# Patient Record
Sex: Male | Born: 1955 | Race: White | Hispanic: No | Marital: Married | Smoking: Never smoker
Health system: Southern US, Community
[De-identification: ages and names within clinical notes are randomized; demographics above are authoritative.]

## PROBLEM LIST (undated history)

## (undated) DIAGNOSIS — M109 Gout, unspecified: Secondary | ICD-10-CM

## (undated) DIAGNOSIS — L570 Actinic keratosis: Secondary | ICD-10-CM

## (undated) DIAGNOSIS — K219 Gastro-esophageal reflux disease without esophagitis: Secondary | ICD-10-CM

## (undated) DIAGNOSIS — J45909 Unspecified asthma, uncomplicated: Secondary | ICD-10-CM

## (undated) HISTORY — DX: Actinic keratosis: L57.0

## (undated) HISTORY — PX: COLONOSCOPY: SHX174

## (undated) HISTORY — PX: NASAL SINUS SURGERY: SHX719

---

## 1985-09-01 HISTORY — PX: APPENDECTOMY: SHX54

## 2007-05-21 ENCOUNTER — Ambulatory Visit: Payer: Self-pay | Admitting: Internal Medicine

## 2007-05-21 IMAGING — CT CT ABD-PELV W/ CM
1 of 2 series · 15 of 32 positions shown, 19 images · non-contrast
Comparison: none

REASON FOR EXAM: Abd and Pelvic Pain
COMMENTS:

PROCEDURE:     STROMAN - STROMAN ABDOMEN / PELVIS W  - [DATE]  [DATE]
RESULT:     Comparison: Report of abdomen CT on [DATE] is reviewed.
Images are not currently available for direct comparison.
TECHNIQUE: CT examination of the abdomen and pelvis was performed after
intravenous administration of 100 cc of [C7] nonionic contrast in
addition to oral contrast. Collimation is 8 mm.

[Series 2: soft tissue · axial · 0.67mm/px · z∈[-985,-561]mm · 15 of 59 slices shown, 19 images]
[im 3/59  soft-tissue]
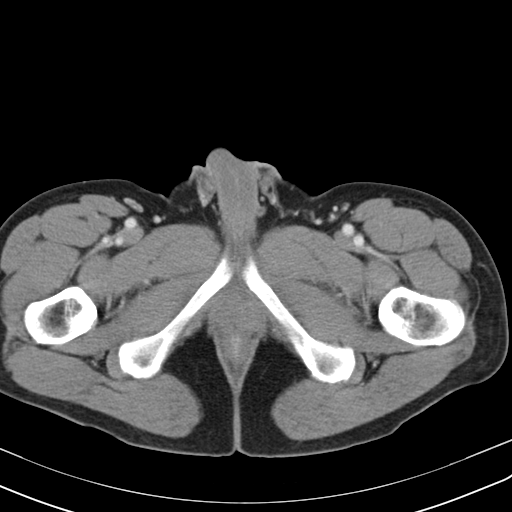
[im 3/59  bone]
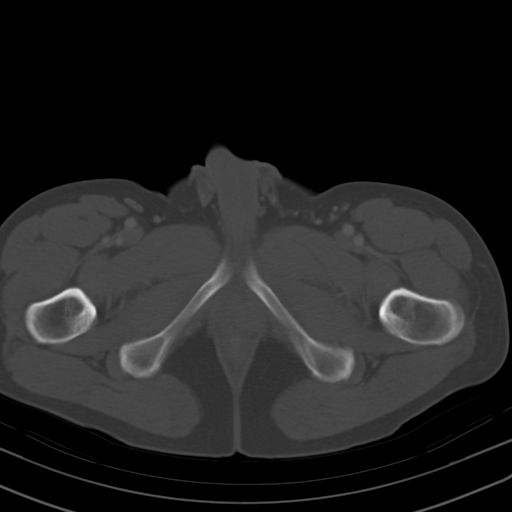
[im 8/59  soft-tissue]
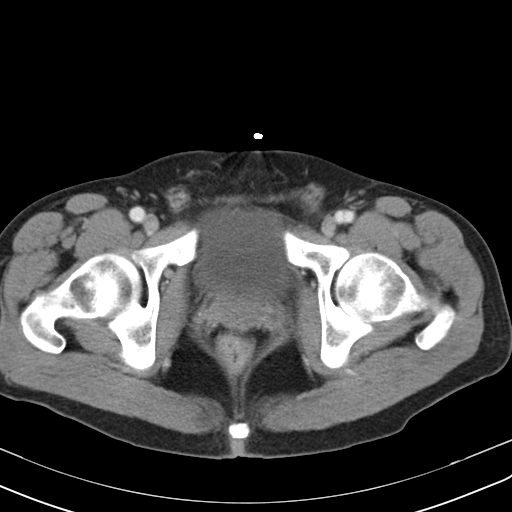
[im 13/59  soft-tissue]
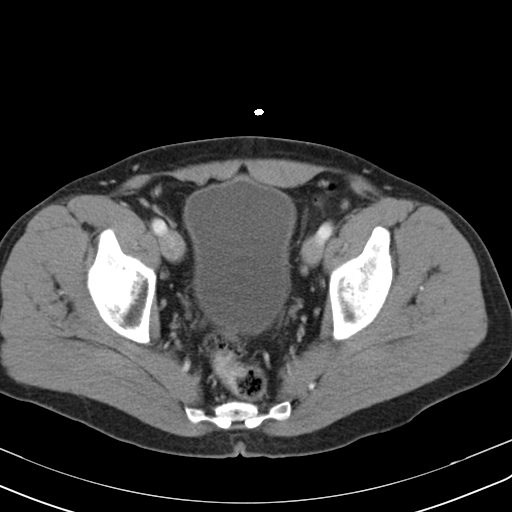
[im 16/59  soft-tissue]
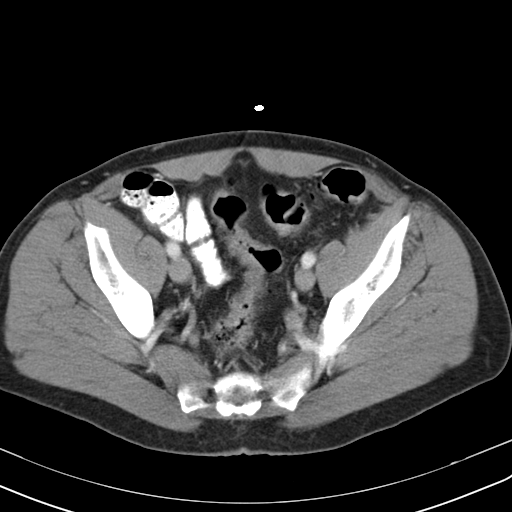
[im 21/59  soft-tissue]
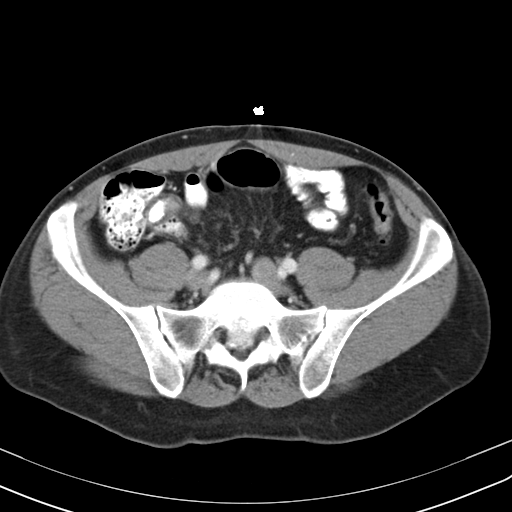
[im 26/59  soft-tissue]
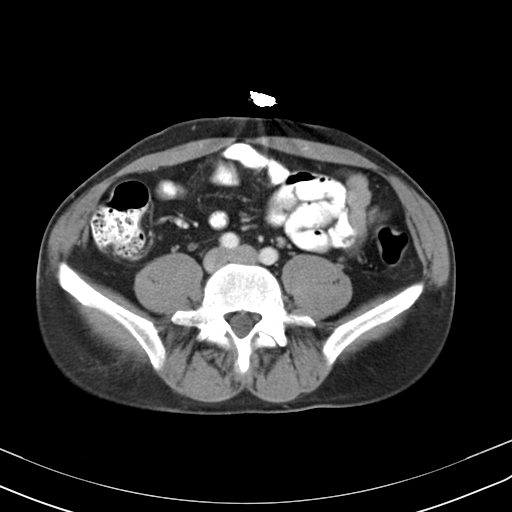
[im 31/59  soft-tissue]
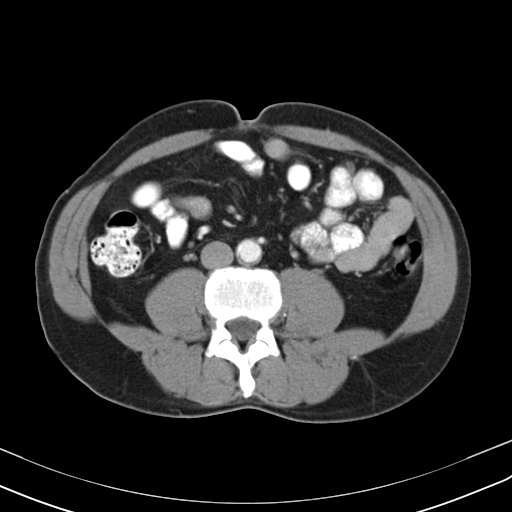
[im 33/59  soft-tissue]
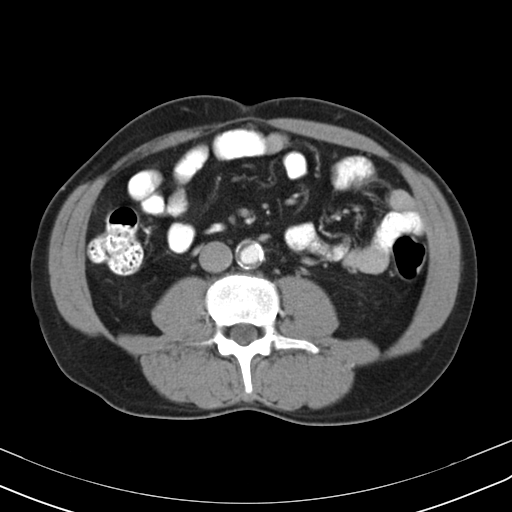
[im 38/59  soft-tissue]
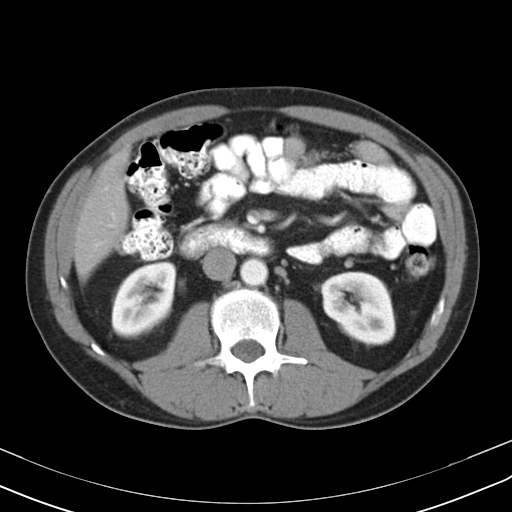
[im 38/59  bone]
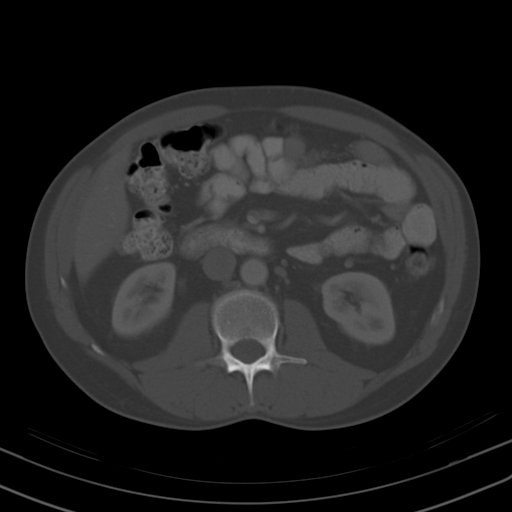
[im 43/59  soft-tissue]
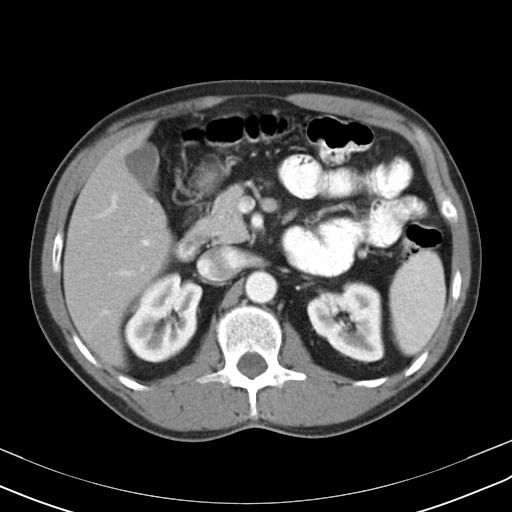
[im 46/59  soft-tissue]
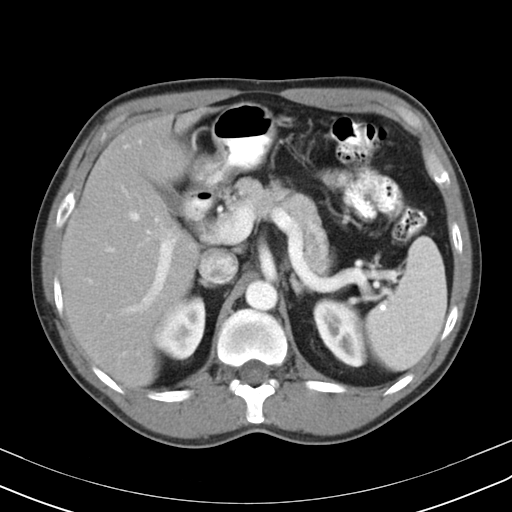
[im 48/59  lung]
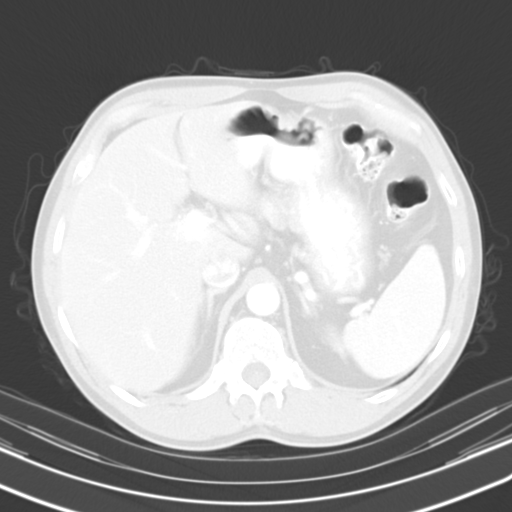
[im 51/59  soft-tissue]
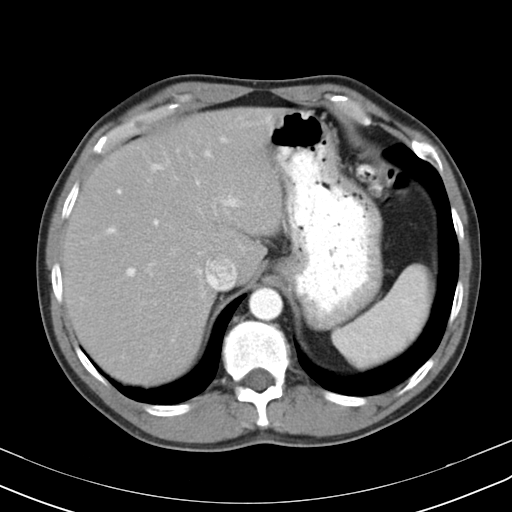
[im 51/59  lung]
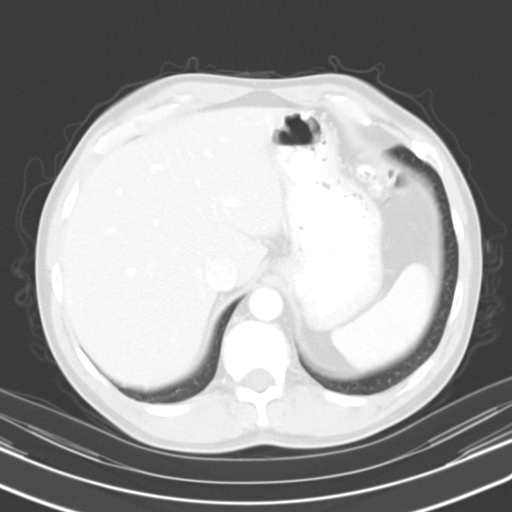
[im 53/59  lung]
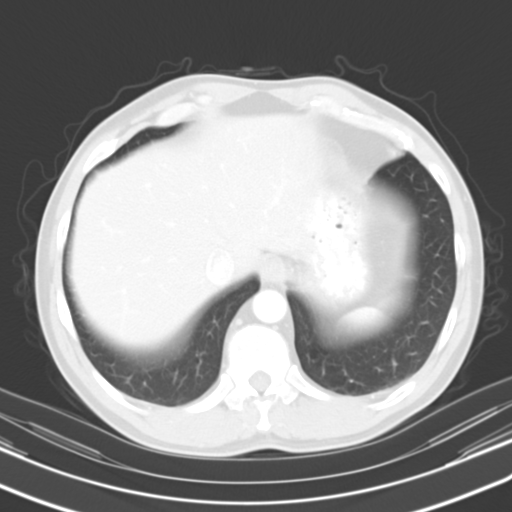
[im 56/59  soft-tissue]
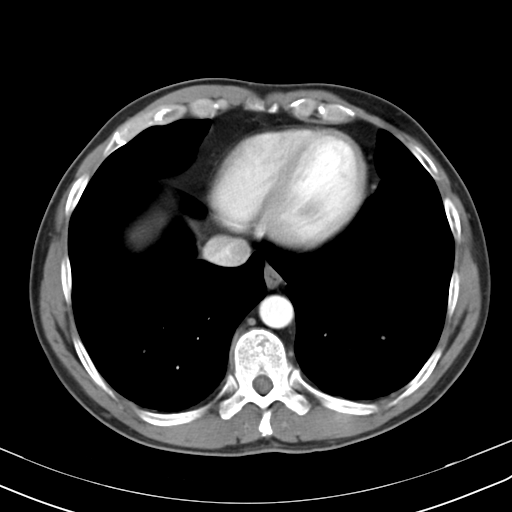
[im 56/59  lung]
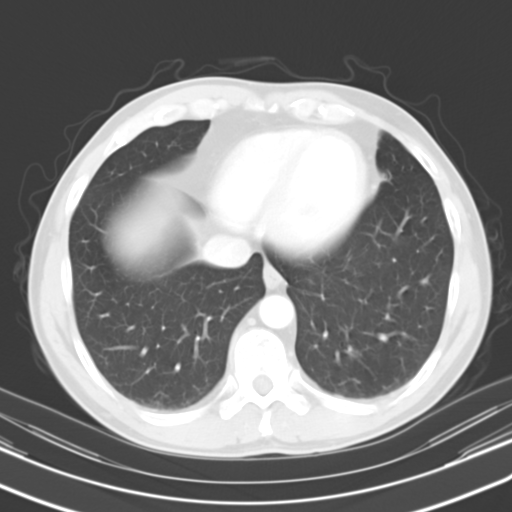

[15 of 32 positions shown; findings below may reference images not displayed]

FINDINGS: Limited evaluation the lung bases shows a calcified 1.1 cm left basilar
pulmonary nodule, likely due to old granulomatous disease.

The liver, gallbladder, pain please come adrenal glands, and kidneys are
unremarkable. A few calcified nodules are seen involving the spleen,
consistent with old granulomatous disease.

There is no dilatation or definite wall thickening of the  bowels. The
appendix is not definitely seen. Mild distal colonic diverticulosis is noted
without evidence of diverticulitis. There is no significant intra
abdominal/pelvic fat stranding. There is no intraperitoneal free air. There
is no significant free fluid. There are no enlarged abdominal pelvic lymph
nodes.
IMPRESSION: 1. Mild distal colonic diverticulosis.

## 2008-04-27 ENCOUNTER — Ambulatory Visit: Payer: Self-pay | Admitting: Unknown Physician Specialty

## 2011-05-02 ENCOUNTER — Ambulatory Visit: Payer: Self-pay | Admitting: Internal Medicine

## 2011-05-02 IMAGING — US ABDOMEN ULTRASOUND LIMITED
1 series · 17 of 25 positions shown · non-contrast
Comparison: none

REASON FOR EXAM: abdominal pain and nausea eval gallbladder
COMMENTS:

[Series 1: abdomen ultrasound limited · 17 of 65 slices shown]
[im 1/65]
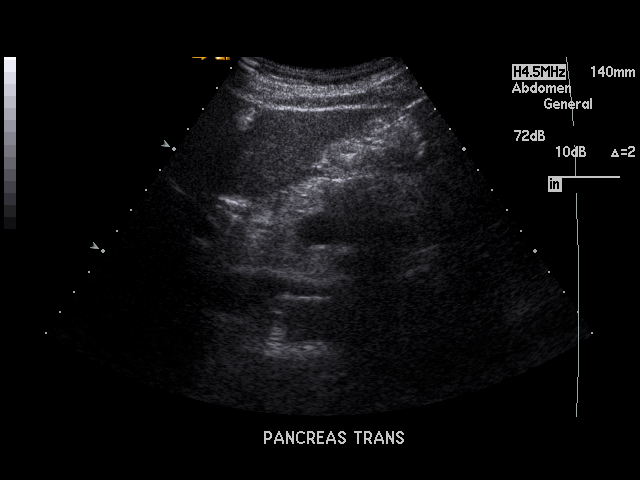
[im 6/65]
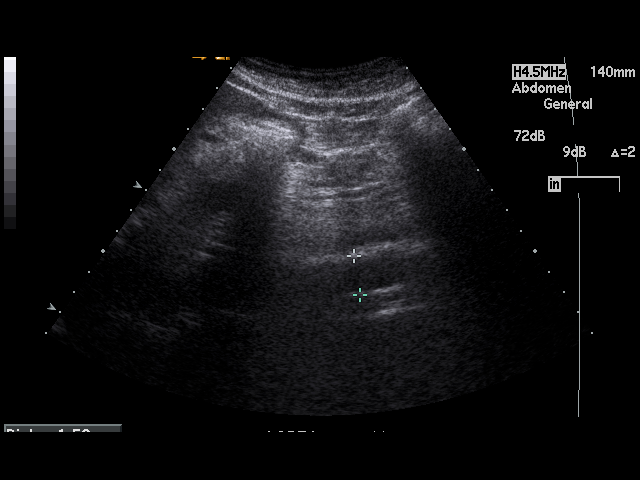
[im 9/65]
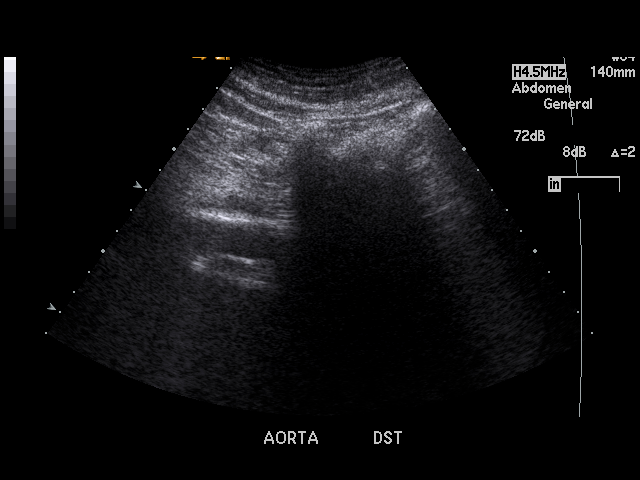
[im 14/65]
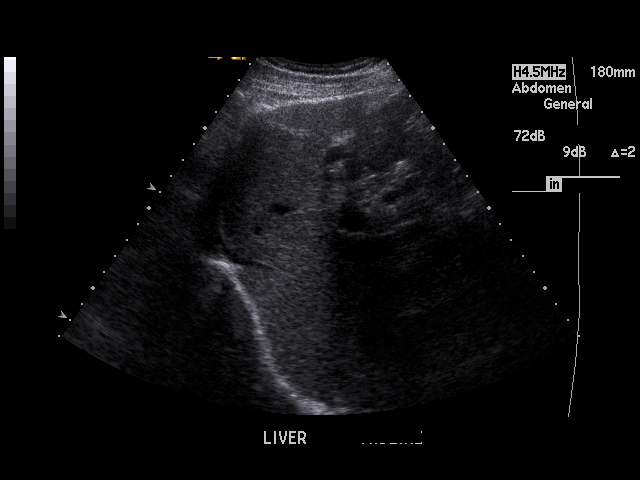
[im 17/65]
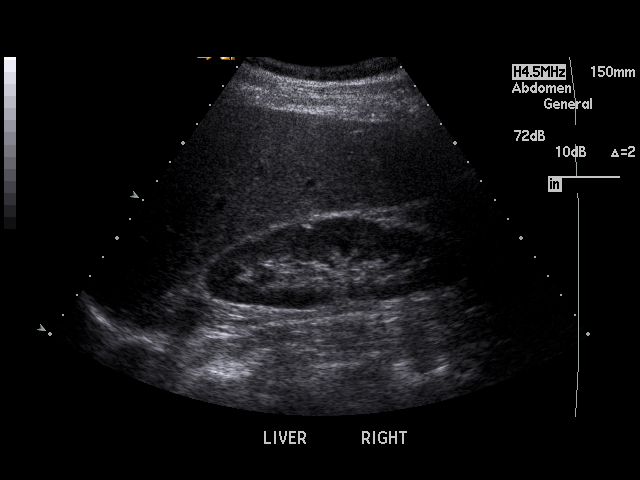
[im 22/65]
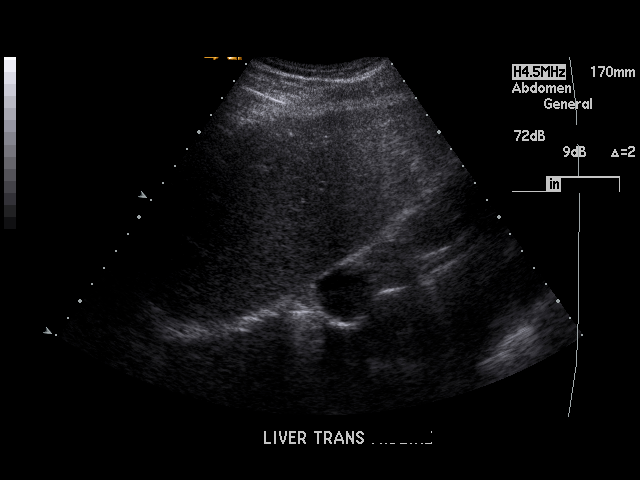
[im 25/65]
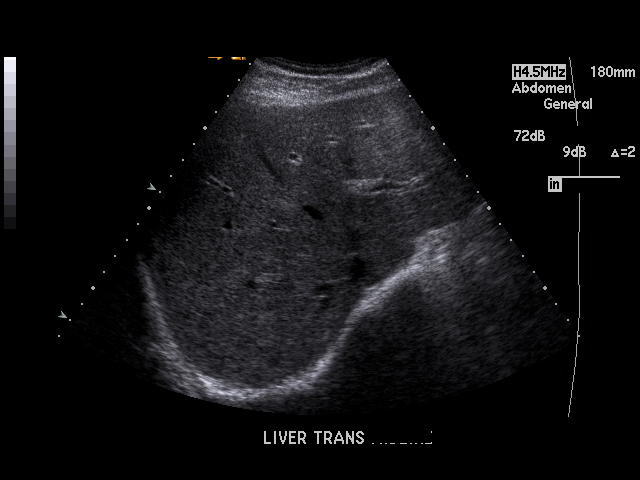
[im 30/65]
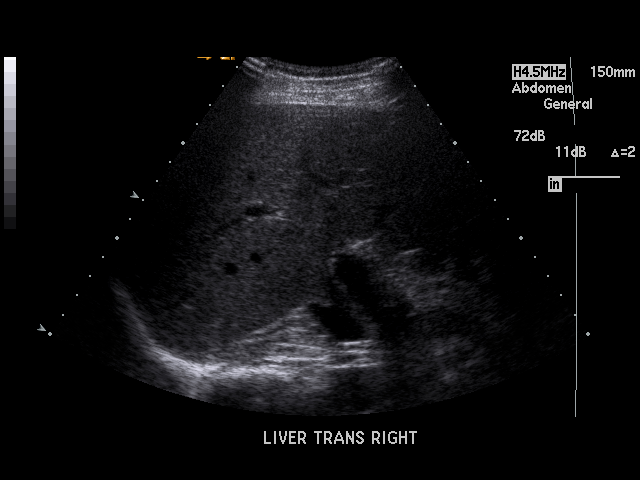
[im 33/65]
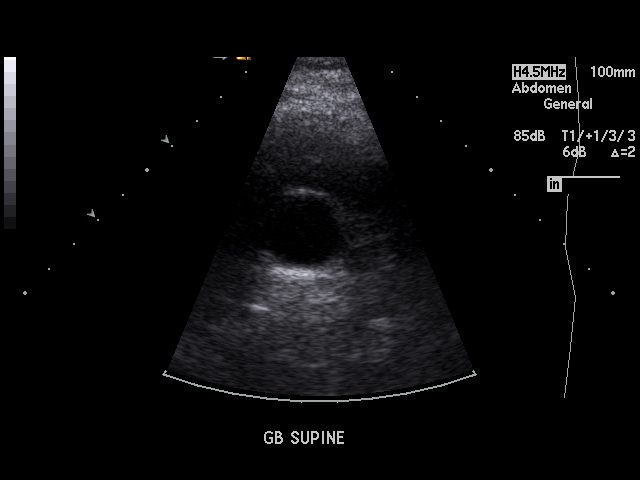
[im 35/65]
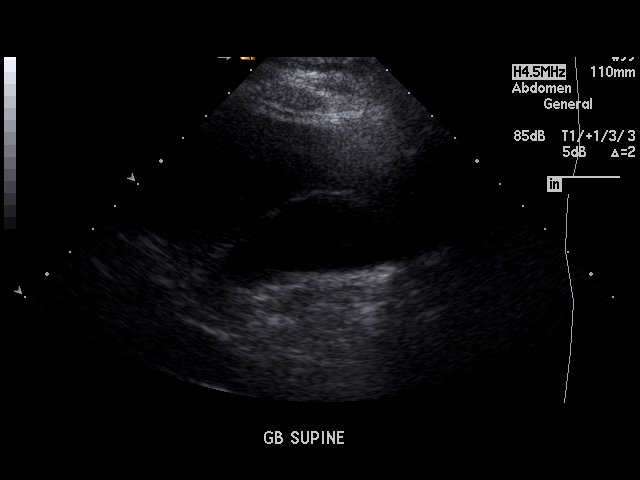
[im 41/65]
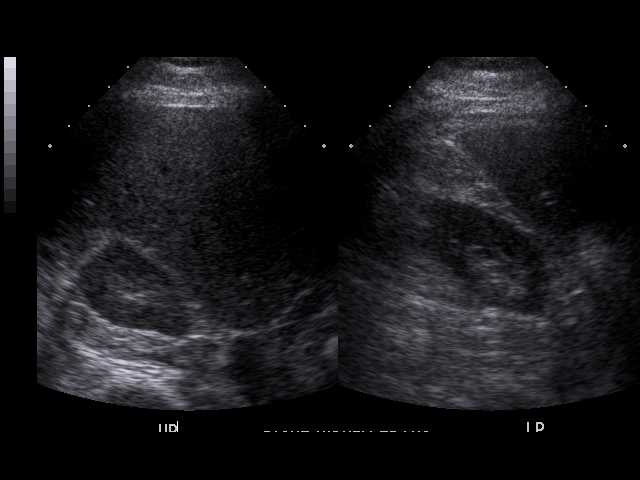
[im 43/65]
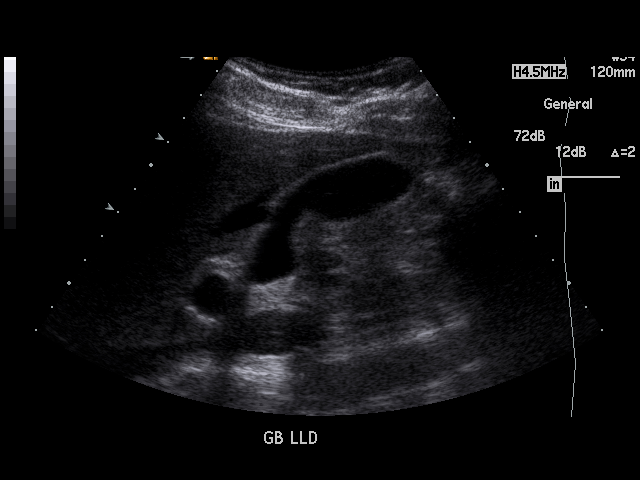
[im 49/65]
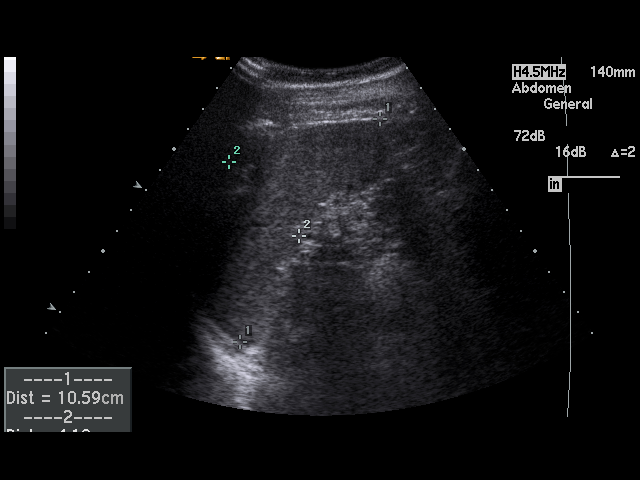
[im 51/65]
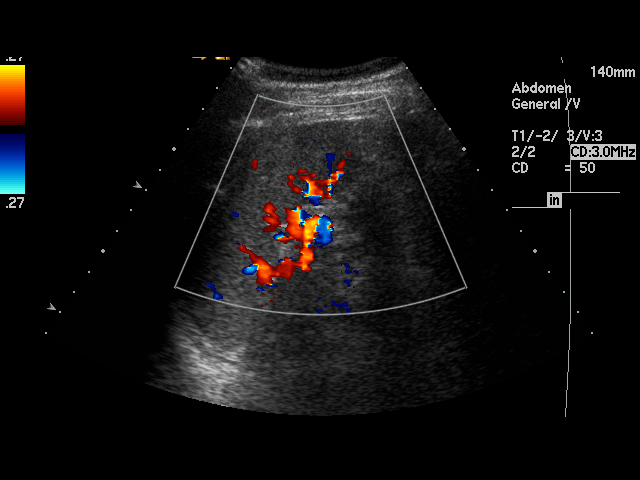
[im 57/65]
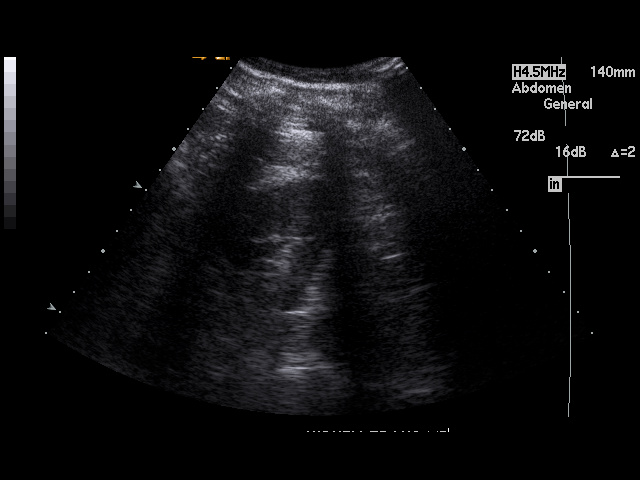
[im 59/65]
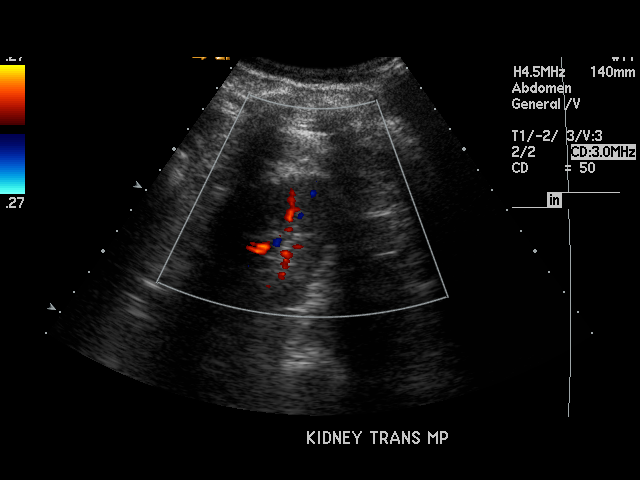
[im 65/65]
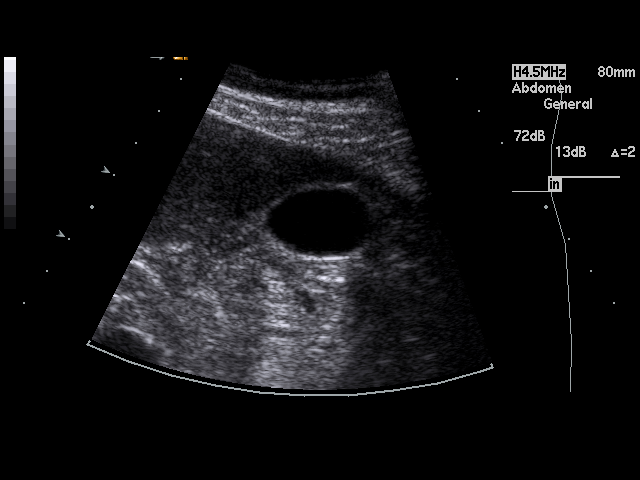

[17 of 25 positions shown; findings below may reference images not displayed]

PROCEDURE:     HARTMANN - HARTMANN ABDOMEN UPPER GENERAL  - [DATE]  [DATE]

RESULT:     The tail of the pancreas is obscured by bowel gas but the
remainder of the pancreas is normal in appearance. The hepatic echo pattern
is normal. The spleen, abdominal aorta and inferior vena cava show no
significant abnormalities. No gallstones are seen. There is no thickening of
the gallbladder wall. The common bile duct measures 2.4 mm in diameter which
is within normal limits. The kidneys reveal no hydronephrosis. There is no
ascites. Sagittally, the right kidney measures 11.1 cm and the left measures
11.2 cm.
IMPRESSION: No significant abnormalities are noted.

## 2011-10-14 ENCOUNTER — Ambulatory Visit: Payer: Self-pay | Admitting: Unknown Physician Specialty

## 2011-10-15 LAB — PATHOLOGY REPORT

## 2016-07-31 ENCOUNTER — Other Ambulatory Visit: Payer: Self-pay | Admitting: Internal Medicine

## 2016-07-31 DIAGNOSIS — R1084 Generalized abdominal pain: Secondary | ICD-10-CM

## 2016-08-01 ENCOUNTER — Ambulatory Visit
Admission: RE | Admit: 2016-08-01 | Discharge: 2016-08-01 | Disposition: A | Discharge status: Home / Self Care | Payer: 59 | Source: Ambulatory Visit | Attending: Internal Medicine | Admitting: Internal Medicine

## 2016-08-01 DIAGNOSIS — I708 Atherosclerosis of other arteries: Secondary | ICD-10-CM | POA: Insufficient documentation

## 2016-08-01 DIAGNOSIS — I700 Atherosclerosis of aorta: Secondary | ICD-10-CM | POA: Insufficient documentation

## 2016-08-01 DIAGNOSIS — D71 Functional disorders of polymorphonuclear neutrophils: Secondary | ICD-10-CM | POA: Diagnosis not present

## 2016-08-01 DIAGNOSIS — R1084 Generalized abdominal pain: Secondary | ICD-10-CM | POA: Insufficient documentation

## 2016-08-01 HISTORY — DX: Unspecified asthma, uncomplicated: J45.909

## 2016-08-01 IMAGING — CT CT ABD-PELV W/ CM
2 of 5 series · 14 of 46 positions shown, 16 images · IV contrast (APPLIED)
Comparison: [DATE]

CLINICAL DATA: Abdominal cramping and pain

EXAM:
CT ABDOMEN AND PELVIS WITH CONTRAST
TECHNIQUE: Multidetector CT imaging of the abdomen and pelvis was performed
using the standard protocol following bolus administration of
intravenous contrast. Oral contrast was also administered.
CONTRAST:  100mL [5T] IOPAMIDOL ([5T]) INJECTION 61%

[Series 2: axial st · axial · 0.67mm/px · z∈[-936,-481]mm · 11 of 101 slices shown, 13 images]
[im 5/101  soft-tissue]
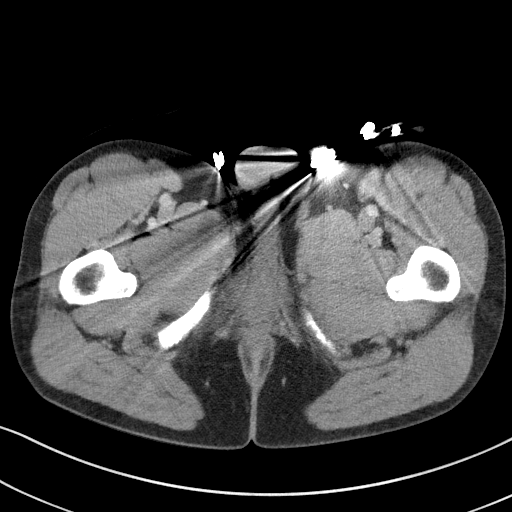
[im 5/101  bone]
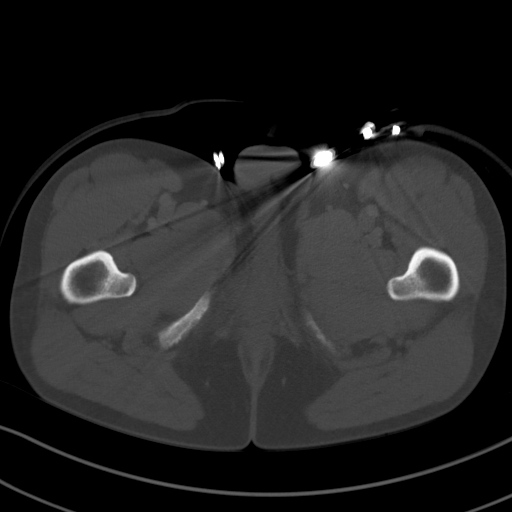
[im 15/101  soft-tissue]
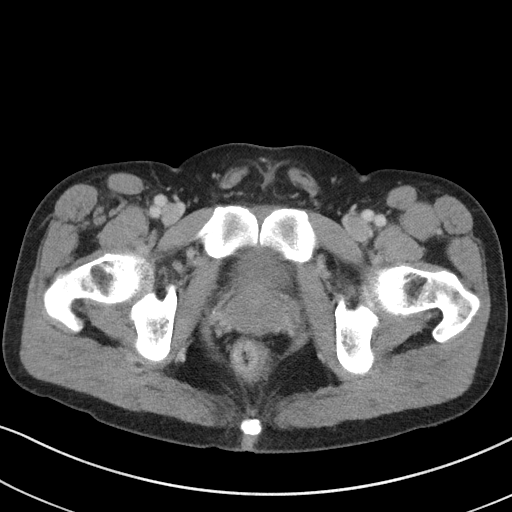
[im 24/101  soft-tissue]
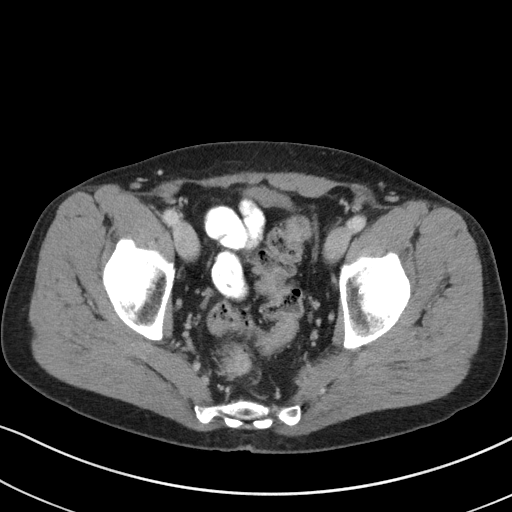
[im 34/101  soft-tissue]
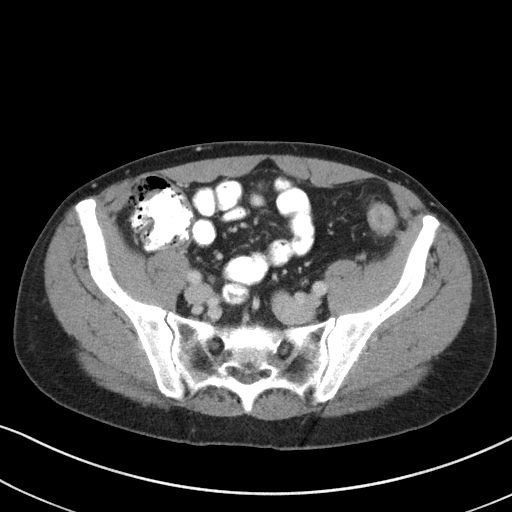
[im 43/101  soft-tissue]
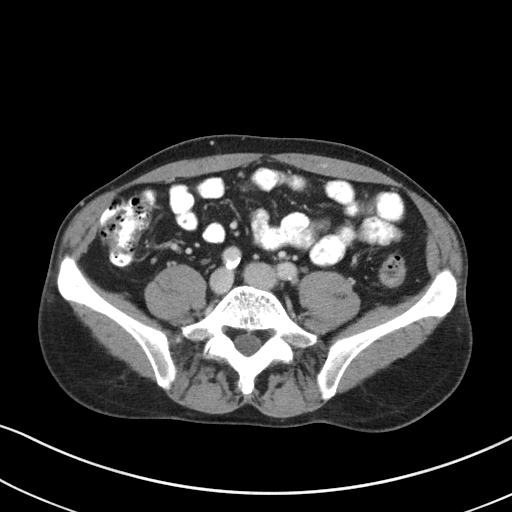
[im 53/101  soft-tissue]
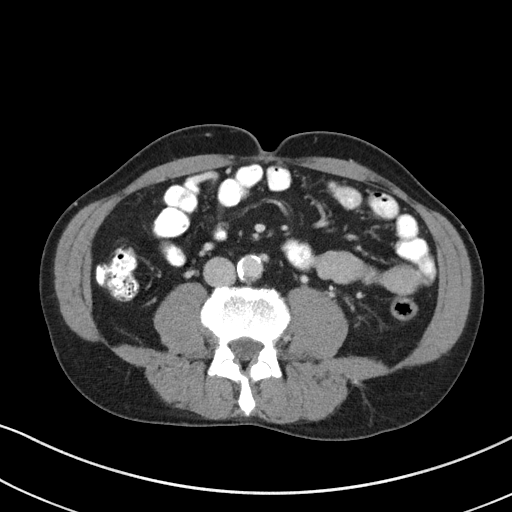
[im 58/101  soft-tissue]
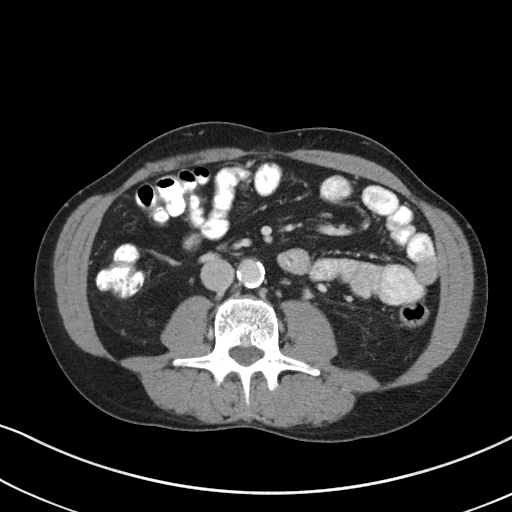
[im 67/101  soft-tissue]
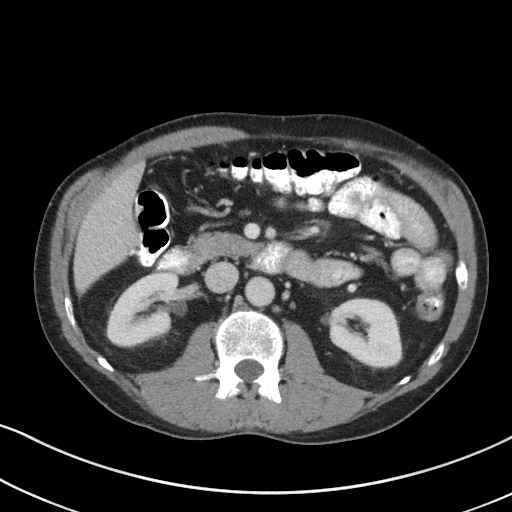
[im 77/101  soft-tissue]
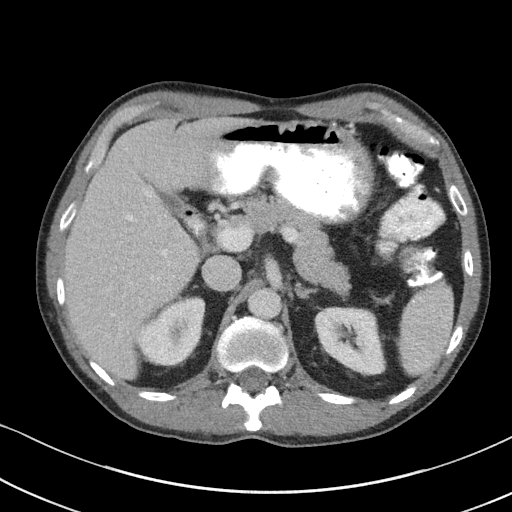
[im 77/101  bone]
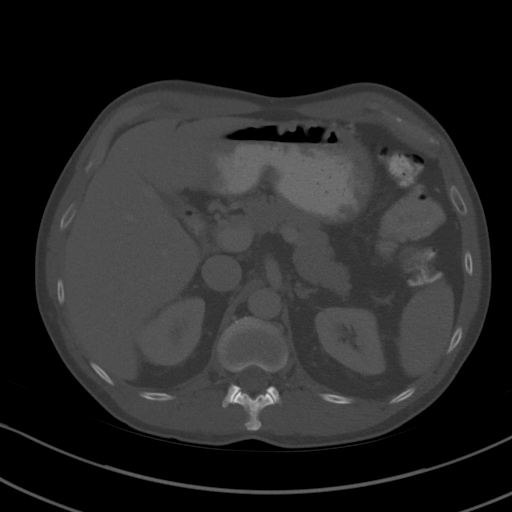
[im 86/101  soft-tissue]
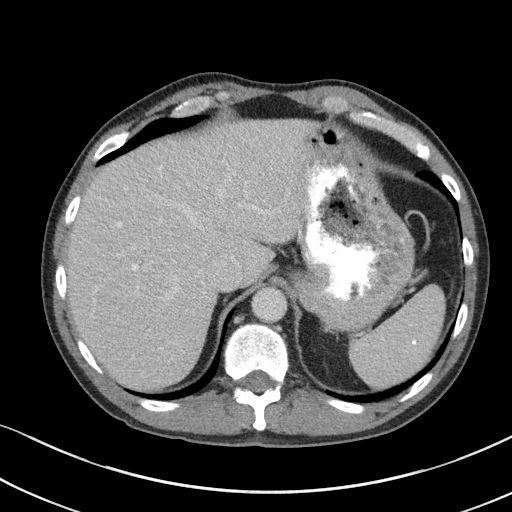
[im 96/101  soft-tissue]
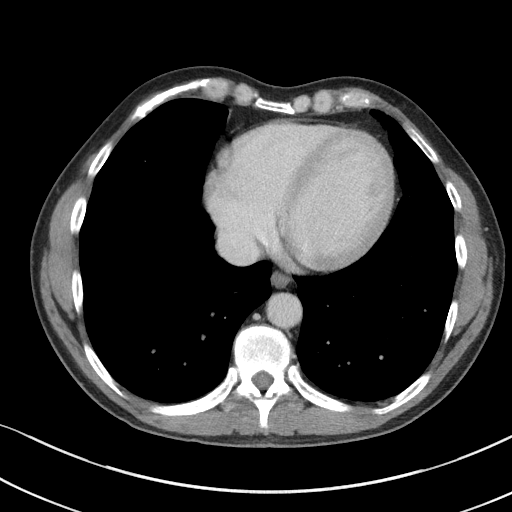

[Series 7: coronal st · coronal · 0.62mm/px · 3 of 78 slices shown]
[im 26/78  soft-tissue]
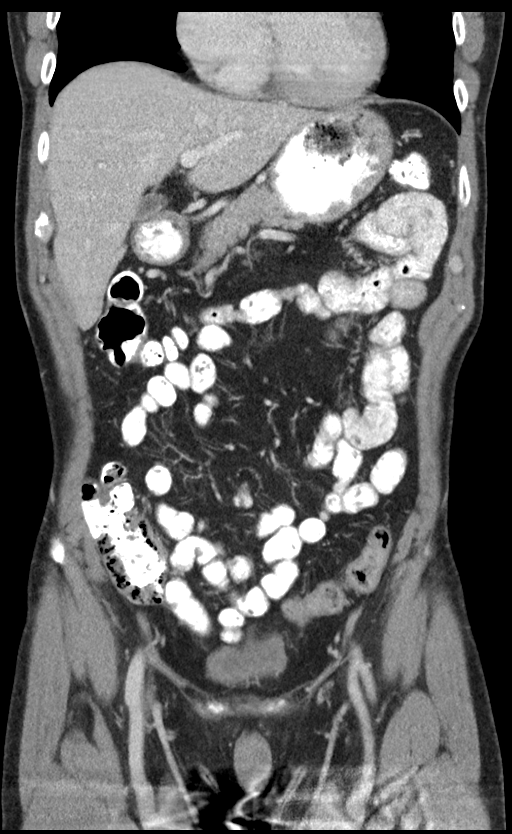
[im 35/78  soft-tissue]
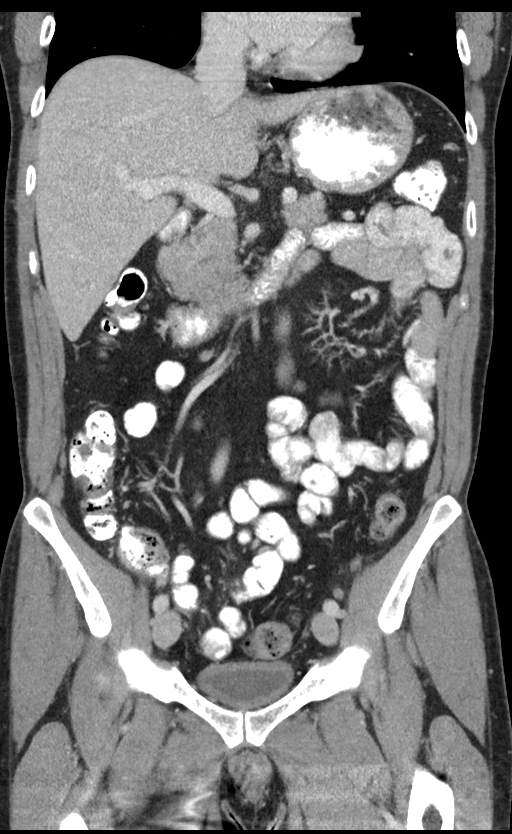
[im 43/78  soft-tissue]
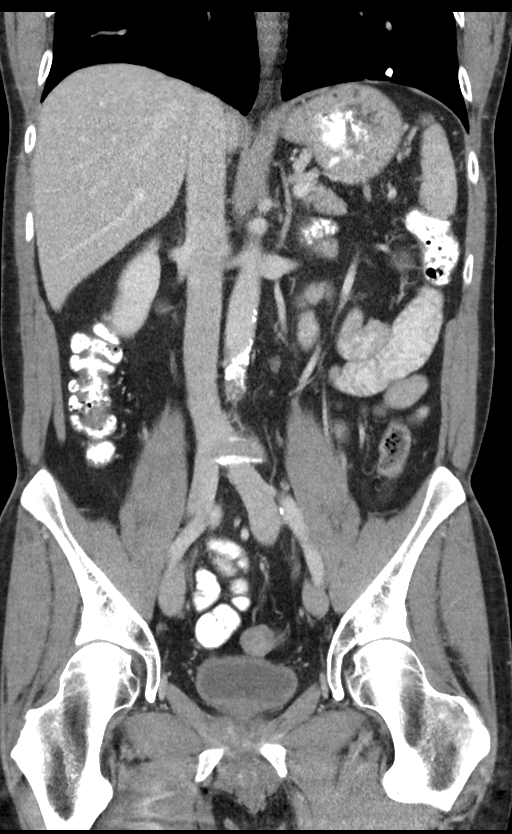

[14 of 46 positions shown; findings below may reference images not displayed]

FINDINGS: Lower chest: There is a calcified granuloma in the left lower lobe
slightly superior to the left hemidiaphragm. Lung bases otherwise
are clear. A small amount of oral contrast is noted in the distal
esophagus.

Hepatobiliary: No focal liver lesions are evident. There is no
appreciable gallbladder wall thickening. There is no biliary duct
dilatation.

Pancreas: There is no evident pancreatic mass or inflammatory focus.

Spleen: No splenic lesions are evident beyond occasional small
calcified splenic granulomas.

Adrenals/Urinary Tract: Adrenals bilaterally appear normal. Kidneys
bilaterally show no evidence of mass or hydronephrosis on either
side. There is no renal or ureteral calculus on either side. Urinary
bladder is midline. Urinary bladder wall is mildly thickened.

Stomach/Bowel: There is no appreciable bowel wall or mesenteric
thickening. There is no evident bowel obstruction. There is no free
air or portal venous air.

Vascular/Lymphatic: There is atherosclerotic calcification in the
aorta and common iliac arteries. There is slight thrombus in the
periphery of the distal aorta. There is no abdominal aortic
aneurysm. The major mesenteric vessels appear intact. There is no
adenopathy in the abdomen or pelvis.

Reproductive: Prostate and seminal vesicles appear normal in size
and contour. There is no pelvic mass or pelvic fluid collection.

Other: Appendix not appreciated. There is no periappendiceal region
inflammation. Terminal ileum appears unremarkable. There is no
ascites or abscess in the abdomen or pelvis.

Musculoskeletal: There are no blastic or lytic bone lesions. There
is no abdominal wall or intramuscular lesion.
IMPRESSION: No bowel wall thickening or bowel obstruction. No abscess. No
periappendiceal region inflammation.

No renal or ureteral calculus.  No hydronephrosis.

Evidence of prior granulomatous disease.

Aortoiliac atherosclerosis.  No abdominal aortic aneurysm.

Small amount of oral contrast in the esophagus distally raises
question of a degree of gastroesophageal reflux.

## 2016-08-01 MED ORDER — IOPAMIDOL (ISOVUE-300) INJECTION 61%
100.0000 mL | Freq: Once | INTRAVENOUS | Status: AC | PRN
  Administered 2016-08-01: 100 mL via INTRAVENOUS

## 2016-11-25 DIAGNOSIS — R0781 Pleurodynia: Secondary | ICD-10-CM | POA: Diagnosis not present

## 2016-11-25 DIAGNOSIS — R972 Elevated prostate specific antigen [PSA]: Secondary | ICD-10-CM | POA: Diagnosis not present

## 2016-11-25 DIAGNOSIS — M1A00X0 Idiopathic chronic gout, unspecified site, without tophus (tophi): Secondary | ICD-10-CM | POA: Diagnosis not present

## 2016-12-02 DIAGNOSIS — Z0000 Encounter for general adult medical examination without abnormal findings: Secondary | ICD-10-CM | POA: Diagnosis not present

## 2016-12-02 DIAGNOSIS — R972 Elevated prostate specific antigen [PSA]: Secondary | ICD-10-CM | POA: Diagnosis not present

## 2016-12-03 DIAGNOSIS — L821 Other seborrheic keratosis: Secondary | ICD-10-CM | POA: Diagnosis not present

## 2016-12-03 DIAGNOSIS — Z1283 Encounter for screening for malignant neoplasm of skin: Secondary | ICD-10-CM | POA: Diagnosis not present

## 2016-12-03 DIAGNOSIS — L570 Actinic keratosis: Secondary | ICD-10-CM | POA: Diagnosis not present

## 2016-12-03 DIAGNOSIS — L578 Other skin changes due to chronic exposure to nonionizing radiation: Secondary | ICD-10-CM | POA: Diagnosis not present

## 2017-03-31 DIAGNOSIS — Z0000 Encounter for general adult medical examination without abnormal findings: Secondary | ICD-10-CM | POA: Diagnosis not present

## 2017-04-27 DIAGNOSIS — N401 Enlarged prostate with lower urinary tract symptoms: Secondary | ICD-10-CM | POA: Diagnosis not present

## 2017-04-27 DIAGNOSIS — R972 Elevated prostate specific antigen [PSA]: Secondary | ICD-10-CM | POA: Diagnosis not present

## 2017-06-07 DIAGNOSIS — Z23 Encounter for immunization: Secondary | ICD-10-CM | POA: Diagnosis not present

## 2017-06-17 DIAGNOSIS — R972 Elevated prostate specific antigen [PSA]: Secondary | ICD-10-CM | POA: Diagnosis not present

## 2017-06-29 DIAGNOSIS — N401 Enlarged prostate with lower urinary tract symptoms: Secondary | ICD-10-CM | POA: Diagnosis not present

## 2017-06-29 DIAGNOSIS — R972 Elevated prostate specific antigen [PSA]: Secondary | ICD-10-CM | POA: Diagnosis not present

## 2017-10-13 DIAGNOSIS — S46011A Strain of muscle(s) and tendon(s) of the rotator cuff of right shoulder, initial encounter: Secondary | ICD-10-CM | POA: Diagnosis not present

## 2017-12-08 DIAGNOSIS — Z0000 Encounter for general adult medical examination without abnormal findings: Secondary | ICD-10-CM | POA: Diagnosis not present

## 2017-12-08 DIAGNOSIS — Z125 Encounter for screening for malignant neoplasm of prostate: Secondary | ICD-10-CM | POA: Diagnosis not present

## 2017-12-15 DIAGNOSIS — Z23 Encounter for immunization: Secondary | ICD-10-CM | POA: Diagnosis not present

## 2017-12-15 DIAGNOSIS — Z0000 Encounter for general adult medical examination without abnormal findings: Secondary | ICD-10-CM | POA: Diagnosis not present

## 2017-12-15 DIAGNOSIS — R972 Elevated prostate specific antigen [PSA]: Secondary | ICD-10-CM | POA: Diagnosis not present

## 2017-12-16 DIAGNOSIS — Z1283 Encounter for screening for malignant neoplasm of skin: Secondary | ICD-10-CM | POA: Diagnosis not present

## 2017-12-16 DIAGNOSIS — D225 Melanocytic nevi of trunk: Secondary | ICD-10-CM | POA: Diagnosis not present

## 2017-12-16 DIAGNOSIS — L570 Actinic keratosis: Secondary | ICD-10-CM | POA: Diagnosis not present

## 2017-12-16 DIAGNOSIS — D1800 Hemangioma unspecified site: Secondary | ICD-10-CM | POA: Diagnosis not present

## 2018-06-18 DIAGNOSIS — R972 Elevated prostate specific antigen [PSA]: Secondary | ICD-10-CM | POA: Diagnosis not present

## 2018-06-28 DIAGNOSIS — N401 Enlarged prostate with lower urinary tract symptoms: Secondary | ICD-10-CM | POA: Diagnosis not present

## 2018-06-28 DIAGNOSIS — R972 Elevated prostate specific antigen [PSA]: Secondary | ICD-10-CM | POA: Diagnosis not present

## 2018-06-28 DIAGNOSIS — Z125 Encounter for screening for malignant neoplasm of prostate: Secondary | ICD-10-CM | POA: Diagnosis not present

## 2018-06-30 ENCOUNTER — Other Ambulatory Visit: Payer: Self-pay | Admitting: Internal Medicine

## 2018-06-30 DIAGNOSIS — R51 Headache: Secondary | ICD-10-CM | POA: Diagnosis not present

## 2018-06-30 DIAGNOSIS — R1084 Generalized abdominal pain: Secondary | ICD-10-CM

## 2018-07-02 ENCOUNTER — Other Ambulatory Visit (HOSPITAL_COMMUNITY): Payer: Self-pay | Admitting: Urology

## 2018-07-02 DIAGNOSIS — R972 Elevated prostate specific antigen [PSA]: Secondary | ICD-10-CM

## 2018-07-05 ENCOUNTER — Ambulatory Visit: Admission: RE | Admit: 2018-07-05 | Payer: 59 | Source: Ambulatory Visit

## 2018-07-08 ENCOUNTER — Ambulatory Visit
Admission: RE | Admit: 2018-07-08 | Discharge: 2018-07-08 | Disposition: A | Discharge status: Home / Self Care | Payer: Commercial Managed Care - HMO | Source: Ambulatory Visit | Attending: Internal Medicine | Admitting: Internal Medicine

## 2018-07-08 DIAGNOSIS — K5730 Diverticulosis of large intestine without perforation or abscess without bleeding: Secondary | ICD-10-CM | POA: Insufficient documentation

## 2018-07-08 DIAGNOSIS — R1084 Generalized abdominal pain: Secondary | ICD-10-CM | POA: Insufficient documentation

## 2018-07-08 DIAGNOSIS — I700 Atherosclerosis of aorta: Secondary | ICD-10-CM | POA: Insufficient documentation

## 2018-07-08 IMAGING — CT CT ABD-PELV W/ CM
2 of 5 series · 15 of 46 positions shown, 17 images · IV contrast (iopamidol)
Comparison: CT Abdomen and Pelvis [DATE], [DATE].

CLINICAL DATA: 62-year-old male with right upper quadrant pain for
6 weeks. Prior appendectomy.

EXAM:
CT ABDOMEN AND PELVIS WITH CONTRAST
TECHNIQUE: Multidetector CT imaging of the abdomen and pelvis was performed
using the standard protocol following bolus administration of
intravenous contrast.
CONTRAST:  100mL [ZN] IOPAMIDOL ([ZN]) INJECTION 61%

[Series 2: abd pelvis · axial · 0.64mm/px · z∈[-1599,-1174]mm · 12 of 95 slices shown, 14 images (1 of 2)]
[im 5/95  soft-tissue]
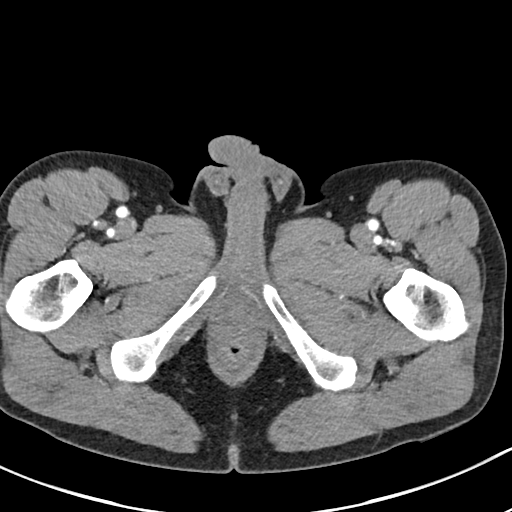
[im 5/95  bone]
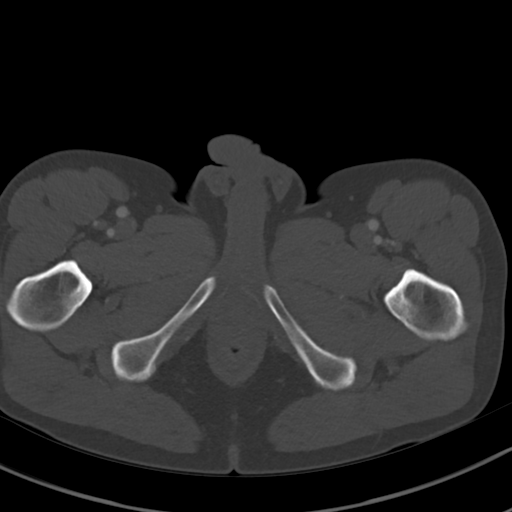
[im 15/95  soft-tissue]
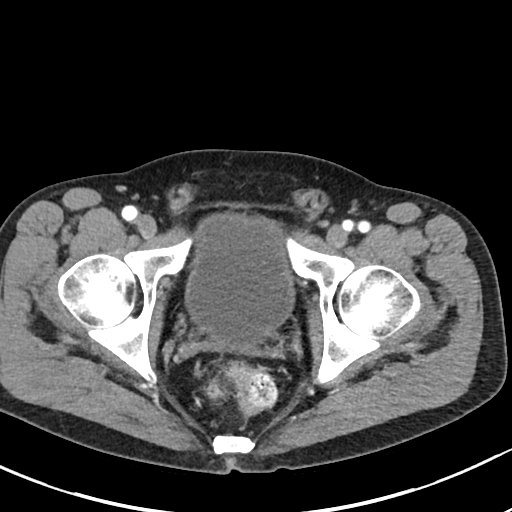
[im 20/95  soft-tissue]
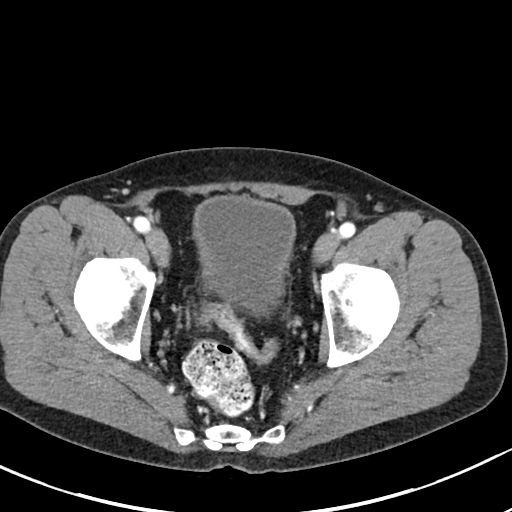
[im 30/95  soft-tissue]
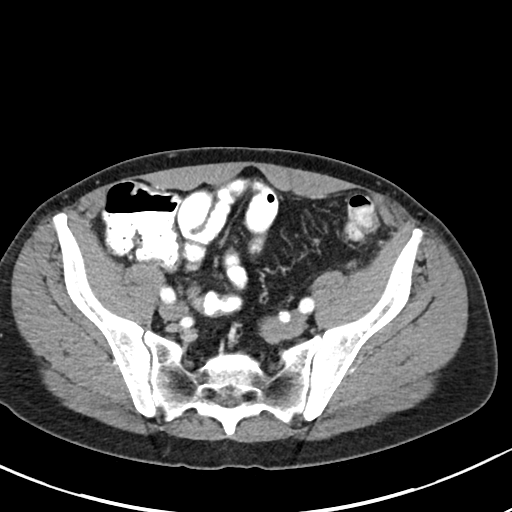
[im 35/95  soft-tissue]
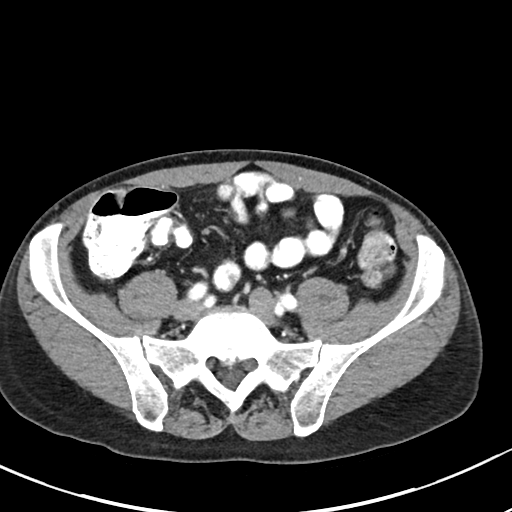
[im 45/95  soft-tissue]
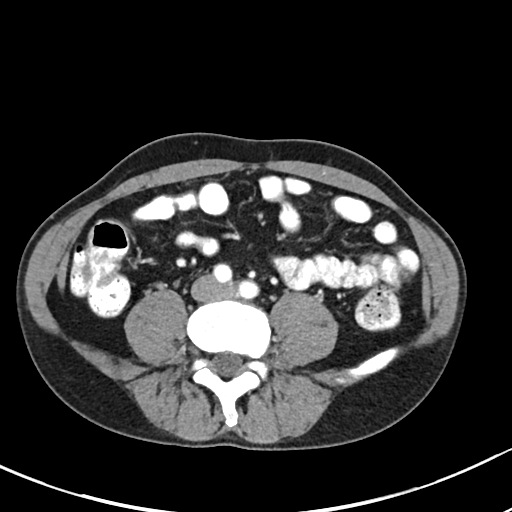
[im 50/95  soft-tissue]
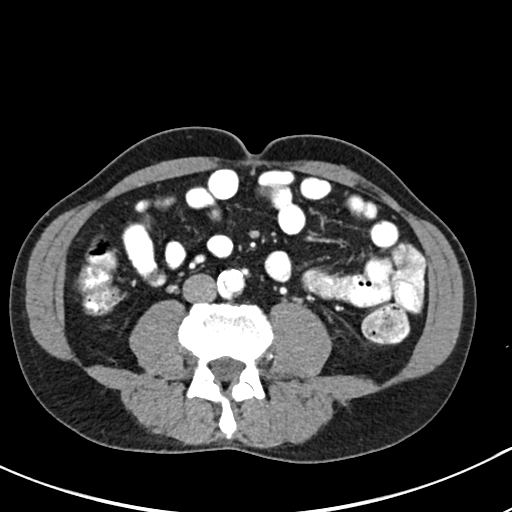
[im 60/95  soft-tissue]
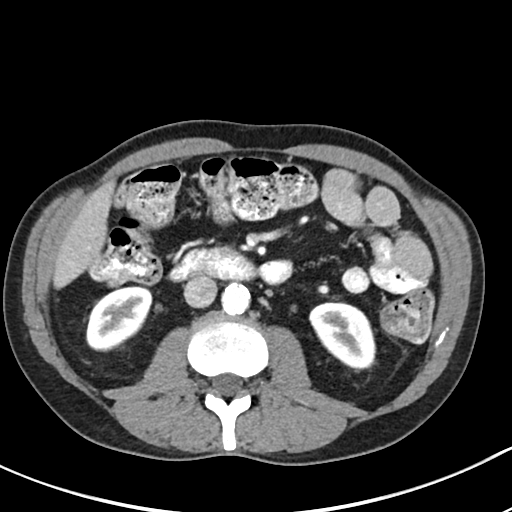
[im 65/95  soft-tissue]
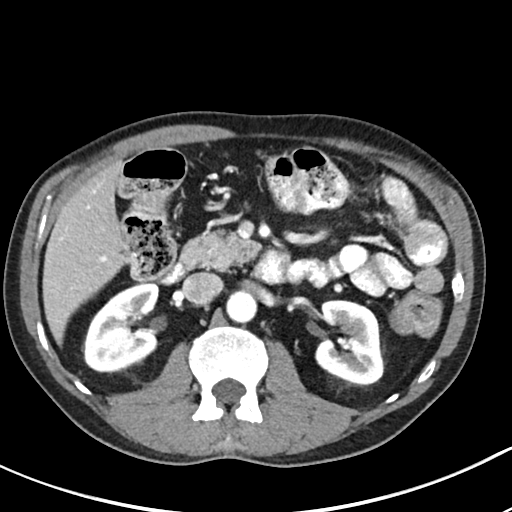
[im 65/95  bone]
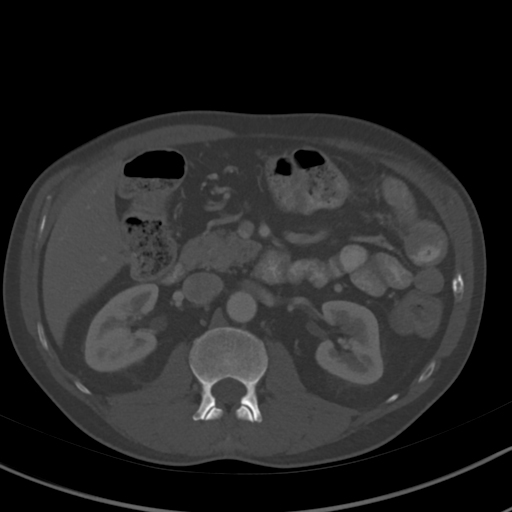
[im 75/95  soft-tissue]
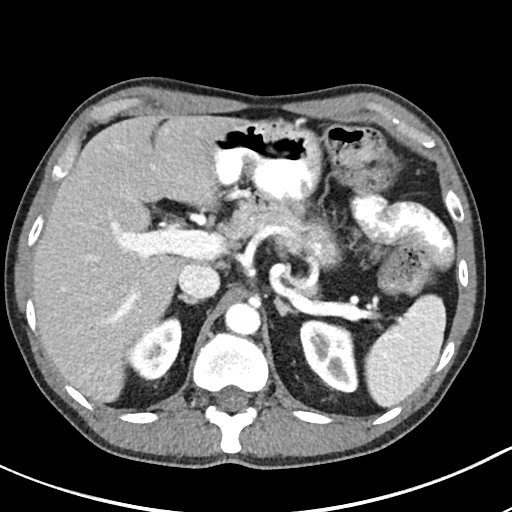
[im 80/95  soft-tissue]
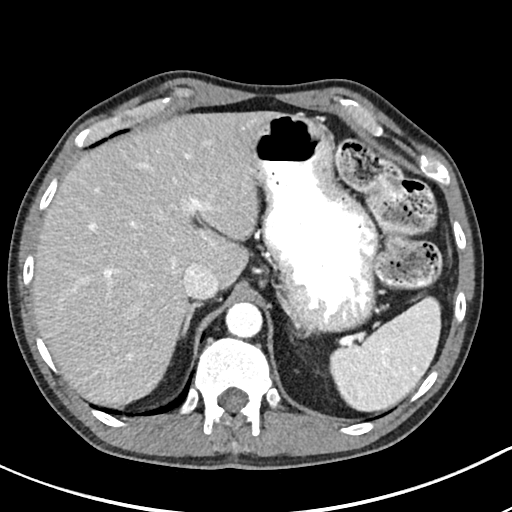
[im 90/95  soft-tissue]
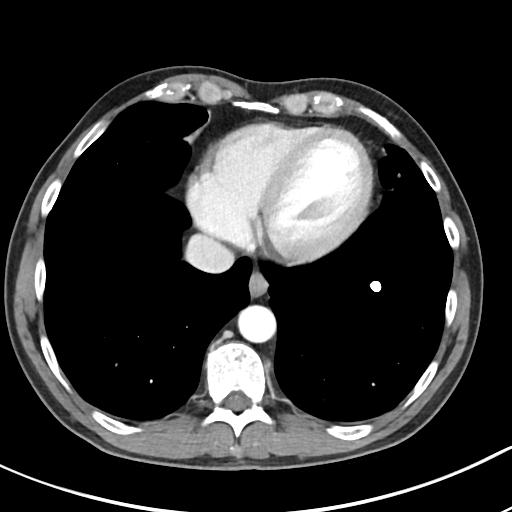

[Series 4: abd pelvis · coronal · 0.64mm/px · 3 of 156 slices shown (2 of 2)]
[im 52/156  soft-tissue]
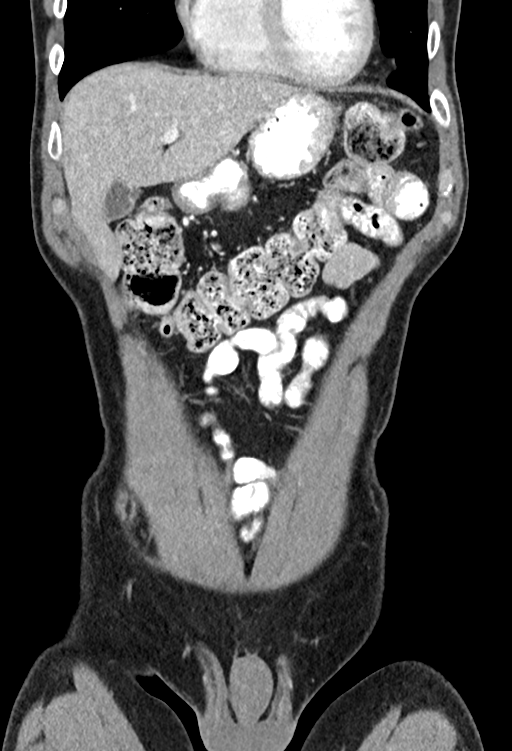
[im 69/156  soft-tissue]
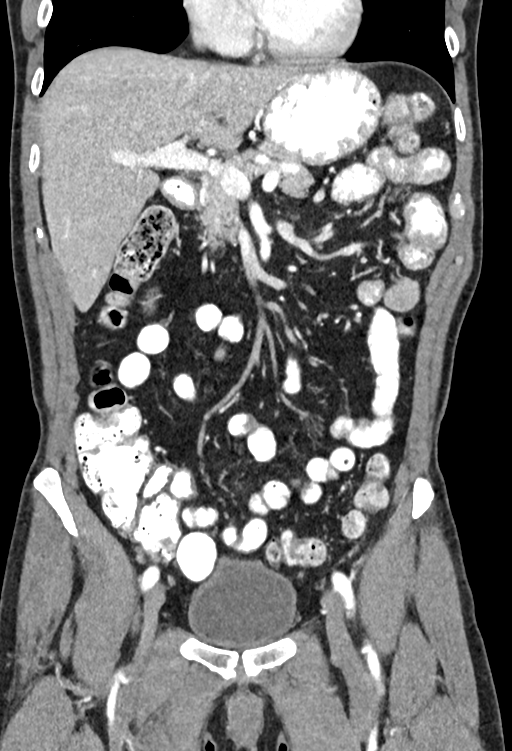
[im 87/156  soft-tissue]
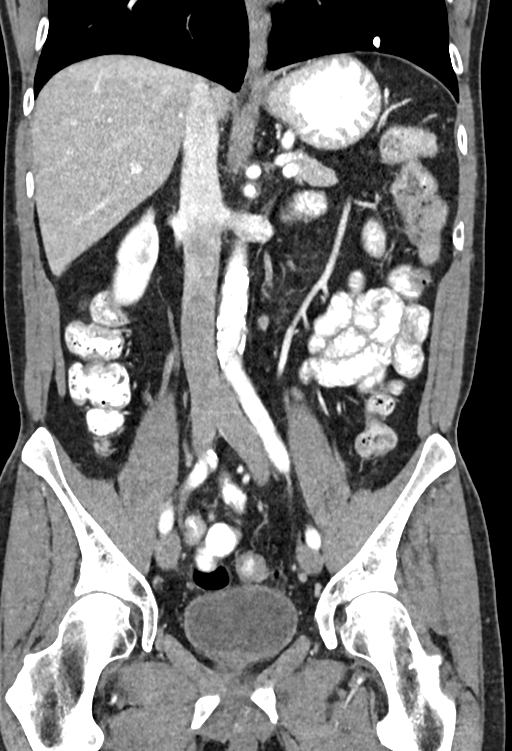

[15 of 46 positions shown; findings below may reference images not displayed]

FINDINGS: Lower chest: Calcified granuloma just over the left hemidiaphragm is
unchanged along with mild chronic atelectasis or scarring in the
medial aspect of both middle lobes. Cardiac size at the upper limits
of normal. No pleural effusion.

Hepatobiliary: Negative liver and gallbladder.

Pancreas: Negative.

Spleen: Negative, several small chronic calcified granulomas.

Adrenals/Urinary Tract: Normal adrenal glands. The kidneys appear
stable and negative. Bilateral renal enhancement and contrast
excretion is symmetric and normal.

Normal course of both ureters.

Unremarkable urinary bladder.

Stomach/Bowel: Oral contrast has reached the rectum without
obstruction. Sigmoid diverticula without active inflammation.
Retained stool in the left colon. Contrast mixed with stool in the
transverse and right colon. No large bowel inflammation.

Negative terminal ileum. No dilated or inflamed small bowel.
Negative stomach and duodenum.

No free air, free fluid.

Vascular/Lymphatic: Calcified aortic atherosclerosis. Major arterial
structures in the abdomen and pelvis are patent. Portal venous
system appears patent.

No lymphadenopathy.

Reproductive: Negative.

Other: No pelvic free fluid.

Musculoskeletal: Negative.
IMPRESSION: 1. No acute or inflammatory process identified in the abdomen or
pelvis to explain abdominal pain.
2. Mild diverticulosis in the sigmoid colon. Remote granulomatous
disease.
3. Aortic Atherosclerosis ([ZN]-[ZN]).

## 2018-07-08 MED ORDER — IOPAMIDOL (ISOVUE-300) INJECTION 61%
100.0000 mL | Freq: Once | INTRAVENOUS | Status: AC | PRN
  Administered 2018-07-08: 100 mL via INTRAVENOUS

## 2018-07-09 ENCOUNTER — Ambulatory Visit (HOSPITAL_COMMUNITY)
Admission: RE | Admit: 2018-07-09 | Discharge: 2018-07-09 | Disposition: A | Discharge status: Home / Self Care | Payer: 59 | Source: Ambulatory Visit | Attending: Urology | Admitting: Urology

## 2018-07-09 DIAGNOSIS — R972 Elevated prostate specific antigen [PSA]: Secondary | ICD-10-CM | POA: Insufficient documentation

## 2018-07-09 MED ORDER — GADOBUTROL 1 MMOL/ML IV SOLN
6.5000 mL | Freq: Once | INTRAVENOUS | Status: AC | PRN
  Administered 2018-07-09: 6.5 mL via INTRAVENOUS

## 2018-07-09 MED ORDER — LIDOCAINE HCL URETHRAL/MUCOSAL 2 % EX GEL
CUTANEOUS | Status: AC
  Filled 2018-07-09: qty 30

## 2018-07-16 DIAGNOSIS — R972 Elevated prostate specific antigen [PSA]: Secondary | ICD-10-CM | POA: Diagnosis not present

## 2018-07-16 DIAGNOSIS — N401 Enlarged prostate with lower urinary tract symptoms: Secondary | ICD-10-CM | POA: Diagnosis not present

## 2018-07-20 NOTE — H&P (Signed)
NAME: Steven Calderon, DINI MEDICAL RECORD OF:[id number] ACCOUNT [id number] DATE OF BIRTH:05/04/1956 FACILITY: [facility] LOCATION:  PHYSICIAN:MICHAEL Farrel Conners, MD  HISTORY AND PHYSICAL  DATE OF ADMISSION:  07/27/2018  CHIEF COMPLAINT:  Elevated PSA.  HISTORY OF PRESENT ILLNESS:  The patient is a 62 year old Caucasian male with elevated PSA, which was evaluated with ultrasound-guided needle biopsy using a standard 12 core biopsy sampling technique in October, which was benign.  PSA was 4.7 at that  time.  Recently, the PSA increased to 7.5 on 06/28/2018.  Evaluation with MRI scan indicated that there were 2 suspicious lesions present.  Lesion #1 was a 0.44 lesion with a PI-RADS 4 categorization involving the right apical peripheral zone.  Lesion #2  was a PI-RADS category 3 lesion measuring 0.5 mL at the right base of the posterior peripheral zone.  The patient comes in now for UroNav image-guided needle biopsy.  ALLERGIES:  Include PENICILLIN AND LIPITOR.  CURRENT MEDICATIONS:  Prilosec, montelukast, rosuvastatin, Uloric, Astelin, Nasonex and Qvar.  PAST SURGICAL HISTORY: 1.  Appendectomy. 2.  Colonoscopy. 3.  Removal of a chest lipoma. 4.  Vasectomy. 5.  Endoscopic sinus surgery. 6.  Upper endoscopy.  PAST AND CURRENT MEDICAL CONDITIONS:  1.  Hypercholesterolemia. 2.  Asthma. 3.  GERD. 4.  Gout. 5.  Allergic rhinitis.  6.  Irritable bowel syndrome.  REVIEW OF SYSTEMS:  The patient denied chest pain, shortness of breath, heart disease, diabetes or stroke.  PHYSICAL EXAMINATION: GENERAL:  A well-nourished, white male in no acute distress. HEENT:  Sclerae were clear.  Pupils are equally round, reactive to light and accommodation.  Extraocular movements were intact. NECK:  No palpable cervical adenopathy.  No audible carotid bruits.  Thyroid gland was smooth and nontender. PULMONARY:  Lungs clear to auscultation. CARDIOVASCULAR:  Regular rhythm and rate without audible  murmurs. ABDOMEN:  Soft, nontender abdomen. GENITOURINARY:  Circumcised.  Testes were smooth, nontender, 20 mL size each. RECTAL:  30 gram smooth, nontender prostate. NEUROMUSCULAR:  Alert and oriented x3.  IMPRESSION: 1.  Elevated PSA. . 2.  Benign prostatic hypertrophy lower urinary tract symptoms.  3.  Two suspicious PI-RADS lesions of the prostate on MRI scan.  PLAN: UroNav fusion image-guided prostate biopsy.  PN/NUANCE  D:07/19/2018 T:07/19/2018 JOB:[id number]

## 2018-07-23 ENCOUNTER — Inpatient Hospital Stay: Admission: RE | Admit: 2018-07-23 | Payer: Self-pay | Source: Ambulatory Visit

## 2018-07-23 HISTORY — DX: Gastro-esophageal reflux disease without esophagitis: K21.9

## 2018-07-23 HISTORY — DX: Gout, unspecified: M10.9

## 2018-07-26 ENCOUNTER — Encounter
Admission: RE | Admit: 2018-07-26 | Discharge: 2018-07-26 | Disposition: A | Discharge status: Home / Self Care | Payer: Commercial Managed Care - HMO | Source: Ambulatory Visit | Attending: Urology | Admitting: Urology

## 2018-07-26 ENCOUNTER — Other Ambulatory Visit: Payer: Self-pay

## 2018-07-26 MED ORDER — GENTAMICIN IN SALINE 1.6-0.9 MG/ML-% IV SOLN
80.0000 mg | INTRAVENOUS | Status: AC
  Administered 2018-07-27: 80 mg via INTRAVENOUS
  Filled 2018-07-26: qty 50

## 2018-07-26 MED ORDER — LEVOFLOXACIN IN D5W 500 MG/100ML IV SOLN
500.0000 mg | Freq: Once | INTRAVENOUS | Status: AC
  Administered 2018-07-27: 500 mg via INTRAVENOUS

## 2018-07-26 MED ORDER — GENTAMICIN SULFATE 40 MG/ML IJ SOLN
80.0000 mg | Freq: Once | INTRAVENOUS | Status: DC
  Filled 2018-07-26: qty 2

## 2018-07-26 NOTE — Patient Instructions (Signed)
Your procedure is scheduled on: 07-27-18 Report to Same Day Surgery 2nd floor medical mall [facility] Entrance-take elevator on left to 2nd floor.  Check in with surgery information desk.) @ 1:15-pt aware of time   Remember: Instructions that are not followed completely may result in serious medical risk, up to and including death, or upon the discretion of your surgeon and anesthesiologist your surgery may need to be rescheduled.    _x___ 1. Do not eat food after midnight the night before your procedure. You may drink clear liquids up to 2 hours before you are scheduled to arrive at the hospital for your procedure.  Do not drink clear liquids within 2 hours of your scheduled arrival to the hospital.  Clear liquids include  --Water or Apple juice without pulp  --Clear carbohydrate beverage such as ClearFast or Gatorade  --Black Coffee or Clear Tea (No milk, no creamers, do not add anything to the coffee or Tea   ____Ensure clear carbohydrate drink on the way to the hospital for bariatric patients  ____Ensure clear carbohydrate drink 3 hours before surgery for Dr Dwyane Luo patients if physician instructed.   No gum chewing or hard candies.     __x__ 2. No Alcohol for 24 hours before or after surgery.   __x__3. No Smoking or e-cigarettes for 24 prior to surgery.  Do not use any chewable tobacco products for at least 6 hour prior to surgery   ____  4. Bring all medications with you on the day of surgery if instructed.    __x__ 5. Notify your doctor if there is any change in your medical condition     (cold, fever, infections).    x___6. On the morning of surgery brush your teeth with toothpaste and water.  You may rinse your mouth with mouth wash if you wish.  Do not swallow any toothpaste or mouthwash.   Do not wear jewelry, make-up, hairpins, clips or nail polish.  Do not wear lotions, powders, or perfumes. You may wear deodorant.  Do not shave 48 hours prior to surgery. Men may  shave face and neck.  Do not bring valuables to the hospital.    [facility] is not responsible for any belongings or valuables.               Contacts, dentures or bridgework may not be worn into surgery.  Leave your suitcase in the car. After surgery it may be brought to your room.  For patients admitted to the hospital, discharge time is determined by your treatment team.  _  Patients discharged the day of surgery will not be allowed to drive home.  You will need someone to drive you home and stay with you the night of your procedure.    Please read over the following fact sheets that you were given:   [facility] Preparing for Surgery  _x___ TAKE THE FOLLOWING MEDICATION THE MORNING OF SURGERY WITH A SMALL SIP OF WATER. These include:  1. ULORIC  2. ZYRTEC  3. PROTONIX  4. TAKE AN EXTRA PROTONIX TONIGHT BEFORE BED  5.  6.  _X___Fleets enema as directed-DO FLEET ENEMA AT HOME 1 HOUR PRIOR TO ARRIVAL TIME TO HOSPITAL  ____ Use CHG Soap or sage wipes as directed on instruction sheet   ____ Use inhalers on the day of surgery and bring to hospital day of surgery  ____ Stop Metformin and Janumet 2 days prior to surgery.    ____ Take 1/2 of usual  insulin dose the night before surgery and none on the morning surgery.   ____ Follow recommendations from Cardiologist, Pulmonologist or PCP regarding stopping Aspirin, Coumadin, Plavix ,Eliquis, Effient, or Pradaxa, and Pletal.  X____Stop Anti-inflammatories such as Advil, Aleve, Ibuprofen, Motrin, Naproxen, Naprosyn, Goodies powders or aspirin products NOW-OK to take Tylenol    _x___ Stop supplements until after surgery-PT LAST TOOK GLUCOSAMINE-CHONDROITIN TODAY (07-26-18)   ____ Bring C-Pap to the hospital.

## 2018-07-27 ENCOUNTER — Encounter: Payer: Self-pay | Admitting: Anesthesiology

## 2018-07-27 ENCOUNTER — Ambulatory Visit
Admission: RE | Admit: 2018-07-27 | Discharge: 2018-07-27 | Disposition: A | Discharge status: Home / Self Care | Payer: Commercial Managed Care - HMO | Source: Ambulatory Visit | Attending: Urology | Admitting: Urology

## 2018-07-27 ENCOUNTER — Ambulatory Visit: Payer: Commercial Managed Care - HMO | Admitting: Anesthesiology

## 2018-07-27 ENCOUNTER — Encounter
Admission: RE | Disposition: A | Discharge status: Home / Self Care | Payer: Self-pay | Source: Ambulatory Visit | Attending: Urology

## 2018-07-27 DIAGNOSIS — J45909 Unspecified asthma, uncomplicated: Secondary | ICD-10-CM | POA: Insufficient documentation

## 2018-07-27 DIAGNOSIS — Z79899 Other long term (current) drug therapy: Secondary | ICD-10-CM | POA: Diagnosis not present

## 2018-07-27 DIAGNOSIS — N401 Enlarged prostate with lower urinary tract symptoms: Secondary | ICD-10-CM | POA: Diagnosis not present

## 2018-07-27 DIAGNOSIS — Z880 Allergy status to penicillin: Secondary | ICD-10-CM | POA: Insufficient documentation

## 2018-07-27 DIAGNOSIS — E7800 Pure hypercholesterolemia, unspecified: Secondary | ICD-10-CM | POA: Insufficient documentation

## 2018-07-27 DIAGNOSIS — M109 Gout, unspecified: Secondary | ICD-10-CM | POA: Diagnosis not present

## 2018-07-27 DIAGNOSIS — K219 Gastro-esophageal reflux disease without esophagitis: Secondary | ICD-10-CM | POA: Insufficient documentation

## 2018-07-27 DIAGNOSIS — K589 Irritable bowel syndrome without diarrhea: Secondary | ICD-10-CM | POA: Diagnosis not present

## 2018-07-27 DIAGNOSIS — R972 Elevated prostate specific antigen [PSA]: Secondary | ICD-10-CM | POA: Diagnosis not present

## 2018-07-27 DIAGNOSIS — Z7951 Long term (current) use of inhaled steroids: Secondary | ICD-10-CM | POA: Diagnosis not present

## 2018-07-27 HISTORY — PX: PROSTATE BIOPSY: SHX241

## 2018-07-27 SURGERY — BIOPSY, PROSTATE
Anesthesia: General | Site: Prostate

## 2018-07-27 MED ORDER — DEXAMETHASONE SODIUM PHOSPHATE 10 MG/ML IJ SOLN
INTRAMUSCULAR | Status: DC | PRN
  Administered 2018-07-27: 10 mg via INTRAVENOUS

## 2018-07-27 MED ORDER — DOCUSATE SODIUM 100 MG PO CAPS: 200.0000 mg | ORAL_CAPSULE | Freq: Two times a day (BID) | ORAL | 3 refills | Status: DC

## 2018-07-27 MED ORDER — FENTANYL CITRATE (PF) 100 MCG/2ML IJ SOLN
INTRAMUSCULAR | Status: AC
  Filled 2018-07-27: qty 2

## 2018-07-27 MED ORDER — ROCURONIUM BROMIDE 100 MG/10ML IV SOLN
INTRAVENOUS | Status: DC | PRN
  Administered 2018-07-27: 30 mg via INTRAVENOUS

## 2018-07-27 MED ORDER — LIDOCAINE HCL (CARDIAC) PF 100 MG/5ML IV SOSY
PREFILLED_SYRINGE | INTRAVENOUS | Status: DC | PRN
  Administered 2018-07-27: 100 mg via INTRAVENOUS

## 2018-07-27 MED ORDER — SUGAMMADEX SODIUM 200 MG/2ML IV SOLN
INTRAVENOUS | Status: AC
  Filled 2018-07-27: qty 2

## 2018-07-27 MED ORDER — FLEET ENEMA 7-19 GM/118ML RE ENEM
1.0000 | ENEMA | Freq: Once | RECTAL | Status: AC
  Administered 2018-07-27: 1 via RECTAL

## 2018-07-27 MED ORDER — SUGAMMADEX SODIUM 200 MG/2ML IV SOLN
INTRAVENOUS | Status: DC | PRN
  Administered 2018-07-27: 150 mg via INTRAVENOUS

## 2018-07-27 MED ORDER — LEVOFLOXACIN 500 MG PO TABS: 500.0000 mg | ORAL_TABLET | Freq: Every day | ORAL | 1 refills | Status: DC

## 2018-07-27 MED ORDER — FENTANYL CITRATE (PF) 100 MCG/2ML IJ SOLN
INTRAMUSCULAR | Status: DC | PRN
  Administered 2018-07-27: 50 ug via INTRAVENOUS

## 2018-07-27 MED ORDER — PROPOFOL 10 MG/ML IV BOLUS
INTRAVENOUS | Status: AC
  Filled 2018-07-27: qty 20

## 2018-07-27 MED ORDER — LEVOFLOXACIN IN D5W 500 MG/100ML IV SOLN
INTRAVENOUS | Status: AC
  Filled 2018-07-27: qty 100

## 2018-07-27 MED ORDER — PROPOFOL 10 MG/ML IV BOLUS
INTRAVENOUS | Status: DC | PRN
  Administered 2018-07-27: 140 mg via INTRAVENOUS

## 2018-07-27 MED ORDER — ACETAMINOPHEN-CODEINE #3 300-30 MG PO TABS: 1.0000 | ORAL_TABLET | ORAL | 1 refills | Status: DC | PRN

## 2018-07-27 MED ORDER — MIDAZOLAM HCL 2 MG/2ML IJ SOLN
INTRAMUSCULAR | Status: AC
  Filled 2018-07-27: qty 2

## 2018-07-27 MED ORDER — LACTATED RINGERS IV SOLN
INTRAVENOUS | Status: DC
  Administered 2018-07-27: 14:00:00 via INTRAVENOUS

## 2018-07-27 MED ORDER — MIDAZOLAM HCL 2 MG/2ML IJ SOLN
INTRAMUSCULAR | Status: DC | PRN
  Administered 2018-07-27: 2 mg via INTRAVENOUS

## 2018-07-27 MED ORDER — ONDANSETRON HCL 4 MG/2ML IJ SOLN: 4.0000 mg | Freq: Once | INTRAMUSCULAR | Status: DC | PRN

## 2018-07-27 MED ORDER — FENTANYL CITRATE (PF) 100 MCG/2ML IJ SOLN: 25.0000 ug | INTRAMUSCULAR | Status: DC | PRN

## 2018-07-27 MED ORDER — SEVOFLURANE IN SOLN
RESPIRATORY_TRACT | Status: AC
  Filled 2018-07-27: qty 250

## 2018-07-27 SURGICAL SUPPLY — 19 items
COVER MAYO STAND STRL (DRAPES) ×3 IMPLANT
COVER TRANSDUCER ULTRASOUND (MISCELLANEOUS) ×2 IMPLANT
COVER WAND RF STERILE (DRAPES) ×3 IMPLANT
GLOVE BIO SURGEON STRL SZ7 (GLOVE) ×6 IMPLANT
GUIDE NDL ENDOCAV 16-18 CVR (NEEDLE) IMPLANT
GUIDE NDL URONAV ULTRASND S (MISCELLANEOUS) IMPLANT
GUIDE NEEDLE ENDOCAV 16-18 CVR (NEEDLE) IMPLANT
GUIDE NEEDLE URONAV ULTRASND S (MISCELLANEOUS) ×1 IMPLANT
INST BIOPSY MAXCORE 18GX25 (NEEDLE) ×3 IMPLANT
NDL GUIDE BIOPSY 644068 (NEEDLE) IMPLANT
NEEDLE GUIDE BIOPSY 644068 (NEEDLE) IMPLANT
PROBE BIOSP ALOKA ALPHA6 PROST (MISCELLANEOUS) ×2 IMPLANT
PROBE URONAV BK 8808E 8818 HLD (MISCELLANEOUS) IMPLANT
SURGILUBE 2OZ TUBE FLIPTOP (MISCELLANEOUS) ×3 IMPLANT
TOWEL OR 17X26 4PK STRL BLUE (TOWEL DISPOSABLE) ×3 IMPLANT
URONAV BK 8808E 8818 PROBE HLD (MISCELLANEOUS) ×3
URONAV MRI FUSION ONE PATIENT (MISCELLANEOUS) ×2 IMPLANT
URONAV ULTRASOUND (MISCELLANEOUS) ×2 IMPLANT
URONAV ULTRASOUND NDL GUIDE S (MISCELLANEOUS) ×3

## 2018-07-27 NOTE — Transfer of Care (Signed)
Immediate Anesthesia Transfer of Care Note  Patient: WELCOME FULTS  Procedure(s) Performed: Procedure(s): URO NAV PROSTATE BIOPSY (N/A)  Patient Location: PACU  Anesthesia Type:General  Level of Consciousness: sedated  Airway & Oxygen Therapy: Patient Spontanous Breathing and Patient connected to face mask oxygen  Post-op Assessment: Report given to RN and Post -op Vital signs reviewed and stable  Post vital signs: Reviewed and stable  Last Vitals:  Vitals:   07/27/18 1640 07/27/18 1650  BP: (!) 168/91   Pulse: (!) 55 (!) 53  Resp: 16   Temp: (!) 35.8 C   SpO2: 241% 146%    Complications: No apparent anesthesia complications

## 2018-07-27 NOTE — Discharge Instructions (Signed)
Transrectal Ultrasound-Guided Biopsy A transrectal ultrasound-guided biopsy is a procedure to remove samples of tissue from your prostate using ultrasound images to guide the procedure. The procedure is usually done to evaluate the prostate gland of men who have an elevated prostate-specific antigen (PSA). PSA is a blood test to screen for prostate cancer. The biopsy samples are taken to check for prostate cancer. Tell a health care provider about: Any allergies you have. All medicines you are taking, including vitamins, herbs, eye drops, creams, and over-the-counter medicines. Any problems you or family members have had with anesthetic medicines. Any blood disorders you have. Any surgeries you have had. Any medical conditions you have. What are the risks? Generally, this is a safe procedure. However, as with any procedure, problems can occur. Possible problems include: Infection of your prostate. Bleeding from your rectum or blood in your urine. Difficulty urinating. Nerve damage (this is usually temporary). Damage to surrounding structures such as blood vessels, organs, and muscles, which would require other procedures.  What happens before the procedure? Do not eat or drink anything after midnight on the night before the procedure or as directed by your health care provider. Take medicines only as directed by your health care provider. Your health care provider may have you stop taking certain medicines 5-7 days before the procedure. You will be given an enema before the procedure. During an enema, a liquid is injected into your rectum to clear out waste. You may have lab tests the day of your procedure. Plan to have someone take you home after the procedure. What happens during the procedure? You will be given medicine to help you relax (sedative) before the procedure. An IV tube will be inserted into one of your veins and used to give fluids and medicine. You will be given antibiotic  medicine to reduce the risk of an infection. You will be placed on your side for the procedure. A probe with lubricated gel will be placed into your rectum, and images will be taken of your prostate and surrounding structures. Numbing medicine will be injected into the prostate before the biopsy samples are taken. A biopsy needle will then be inserted and guided to your prostate with the use of the ultrasound images. Samples of prostate tissue will be taken, and the needle will then be removed. The biopsy samples will be sent to a lab to be analyzed. Results are usually back in 2-3 days. What happens after the procedure? You will be taken to a recovery area where you will be monitored. You may have some discomfort in the rectal area. You will be given pain medicines to control this. You may be allowed to go home the same day, or you may need to stay in the hospital overnight. This information is not intended to replace advice given to you by your health care provider. Make sure you discuss any questions you have with your health care provider. Document Released: 01/02/2014 Document Revised: 01/24/2016 Document Reviewed: 04/06/2013 Elsevier Interactive Patient Education  2018 [place]. Transrectal Ultrasound-Guided Biopsy A transrectal ultrasound-guided biopsy is a procedure to take samples of tissue from your prostate. Ultrasound images are used to guide the procedure. It is usually done to check the prostate gland for cancer. What happens before the procedure?  Do not eat or drink after midnight on the night before your procedure.  Take medicines as your doctor tells you.  Your doctor may have you stop taking some medicines 5-7 days before the procedure.  You  will be given an enema before your procedure. During an enema, a liquid is put into your butt (rectum) to clear out waste.  You may have lab tests the day of your procedure.  Make plans to have someone drive you home. What  happens during the procedure?  You will be given medicine to help you relax before the procedure. An IV tube will be put into one of your veins. It will be used to give fluids and medicine.  You will be given medicine to reduce the risk of infection (antibiotic).  You will be placed on your side.  A probe with gel will be put in your butt. This is used to take pictures of your prostate and the area around it.  A medicine to numb the area is put into your prostate.  A biopsy needle is then inserted and guided to your prostate.  Samples of prostate tissue are taken. The needle is removed.  The samples are sent to a lab to be checked. Results are usually back in 2-3 days. What happens after the procedure?  You will be taken to a room where you will be watched until you are doing okay.  You may have some pain in the area around your butt. You will be given medicines for this.  You may be able to go home the same day. Sometimes, an overnight stay in the hospital is needed. This information is not intended to replace advice given to you by your health care provider. Make sure you discuss any questions you have with your health care provider. Document Released: 08/06/2009 Document Revised: 01/24/2016 Document Reviewed: 04/06/2013 Elsevier Interactive Patient Education  [employer].

## 2018-07-27 NOTE — Op Note (Signed)
Preoperative diagnosis: 1.  Elevated PSA                                             2.  High-grade PI-RADS lesions on MRI scan  Postoperative diagnosis: Same  Procedure: 1.  Image guided fusion biopsy the prostate with Uronav system                      2.  Systematic prostate biopsies  Surgeon: Otelia Limes. Yves Dill MD  Anesthesia: General  Indications:See the history and physical. After informed consent the above procedure(s) were requested     Technique and findings: After adequate general anesthesia had been obtained the patient was placed in the left lateral decubitus position.  The Vernelle Emerald was positioned transrectal ultrasound probe was placed and images acquired.  Images were then fused with the MRI images.  3 core biopsies were then taken of the region of interest #1 at the peripheral zone right base.  Next, 3 core biopsies were taken of region of interest #2 at the right apex peripheral zone.  At this point standard 12 core systematic biopsies were performed.  Blood loss was minimal.  The procedure was then terminated and patient transferred to the recovery room in stable condition.

## 2018-07-27 NOTE — Anesthesia Post-op Follow-up Note (Signed)
Anesthesia QCDR form completed.        

## 2018-07-27 NOTE — Anesthesia Procedure Notes (Addendum)
Procedure Name: Intubation Date/Time: 07/27/2018 3:07 PM Performed by: Doreen Salvage, CRNA Pre-anesthesia Checklist: Patient identified, Patient being monitored, Timeout performed, Emergency Drugs available and Suction available Patient Re-evaluated:Patient Re-evaluated prior to induction Oxygen Delivery Method: Circle system utilized Preoxygenation: Pre-oxygenation with 100% oxygen Induction Type: IV induction and Cricoid Pressure applied Ventilation: Mask ventilation without difficulty Laryngoscope Size: Mac and 3 Grade View: Grade II Tube type: Oral Tube size: 7.0 mm Number of attempts: 1 Airway Equipment and Method: Stylet Placement Confirmation: ETT inserted through vocal cords under direct vision,  positive ETCO2 and breath sounds checked- equal and bilateral Secured at: 21 cm Tube secured with: Tape Dental Injury: Teeth and Oropharynx as per pre-operative assessment  Difficulty Due To: Difficult Airway- due to anterior larynx

## 2018-07-27 NOTE — H&P (Signed)
Date of Initial H&P: 07/19/18  History reviewed, patient examined, no change in status, stable for surgery.

## 2018-07-27 NOTE — Anesthesia Preprocedure Evaluation (Signed)
Anesthesia Evaluation  Patient identified by MRN, date of birth, ID band Patient awake    Reviewed: Allergy & Precautions, NPO status , Patient's Chart, lab work & pertinent test results, reviewed documented beta blocker date and time   Airway Mallampati: II  TM Distance: >3 FB     Dental  (+) Chipped   Pulmonary asthma ,           Cardiovascular      Neuro/Psych    GI/Hepatic GERD  Controlled,  Endo/Other    Renal/GU      Musculoskeletal   Abdominal   Peds  Hematology   Anesthesia Other Findings   Reproductive/Obstetrics                             Anesthesia Physical Anesthesia Plan  ASA: II  Anesthesia Plan: General   Post-op Pain Management:    Induction: Intravenous  PONV Risk Score and Plan:   Airway Management Planned: Oral ETT  Additional Equipment:   Intra-op Plan:   Post-operative Plan:   Informed Consent: I have reviewed the patients History and Physical, chart, labs and discussed the procedure including the risks, benefits and alternatives for the proposed anesthesia with the patient or authorized representative who has indicated his/her understanding and acceptance.     Plan Discussed with: CRNA  Anesthesia Plan Comments:         Anesthesia Quick Evaluation

## 2018-07-27 NOTE — Anesthesia Postprocedure Evaluation (Signed)
Anesthesia Post Note  Patient: Steven Calderon  Procedure(s) Performed: URO NAV PROSTATE BIOPSY (N/A Prostate)  Patient location during evaluation: PACU Anesthesia Type: General Level of consciousness: awake and alert Pain management: pain level controlled Vital Signs Assessment: post-procedure vital signs reviewed and stable Respiratory status: spontaneous breathing and respiratory function stable Cardiovascular status: stable Anesthetic complications: no     Last Vitals:  Vitals:   07/27/18 1640 07/27/18 1650  BP: (!) 168/91   Pulse: (!) 55 (!) 53  Resp: 16   Temp: (!) 35.8 C   SpO2: 100% 100%    Last Pain:  Vitals:   07/27/18 1650  TempSrc:   PainSc: 0-No pain                 KEPHART,WILLIAM K

## 2018-07-28 ENCOUNTER — Encounter: Payer: Self-pay | Admitting: Urology

## 2018-08-06 ENCOUNTER — Encounter: Payer: Self-pay | Admitting: Urology

## 2018-08-12 DIAGNOSIS — R972 Elevated prostate specific antigen [PSA]: Secondary | ICD-10-CM | POA: Diagnosis not present

## 2018-08-12 DIAGNOSIS — N401 Enlarged prostate with lower urinary tract symptoms: Secondary | ICD-10-CM | POA: Diagnosis not present

## 2018-08-24 ENCOUNTER — Encounter: Payer: Self-pay | Admitting: Urology

## 2018-08-24 LAB — SURGICAL PATHOLOGY

## 2018-08-30 ENCOUNTER — Encounter: Payer: Self-pay | Admitting: Urology

## 2018-09-02 DIAGNOSIS — R972 Elevated prostate specific antigen [PSA]: Secondary | ICD-10-CM | POA: Diagnosis not present

## 2018-12-13 DIAGNOSIS — Z0000 Encounter for general adult medical examination without abnormal findings: Secondary | ICD-10-CM | POA: Diagnosis not present

## 2018-12-17 DIAGNOSIS — R972 Elevated prostate specific antigen [PSA]: Secondary | ICD-10-CM | POA: Diagnosis not present

## 2018-12-17 DIAGNOSIS — Z0000 Encounter for general adult medical examination without abnormal findings: Secondary | ICD-10-CM | POA: Diagnosis not present

## 2018-12-23 DIAGNOSIS — Z1283 Encounter for screening for malignant neoplasm of skin: Secondary | ICD-10-CM | POA: Diagnosis not present

## 2018-12-23 DIAGNOSIS — L570 Actinic keratosis: Secondary | ICD-10-CM | POA: Diagnosis not present

## 2018-12-23 DIAGNOSIS — D225 Melanocytic nevi of trunk: Secondary | ICD-10-CM | POA: Diagnosis not present

## 2018-12-23 DIAGNOSIS — L578 Other skin changes due to chronic exposure to nonionizing radiation: Secondary | ICD-10-CM | POA: Diagnosis not present

## 2018-12-27 DIAGNOSIS — N401 Enlarged prostate with lower urinary tract symptoms: Secondary | ICD-10-CM | POA: Diagnosis not present

## 2018-12-27 DIAGNOSIS — R972 Elevated prostate specific antigen [PSA]: Secondary | ICD-10-CM | POA: Diagnosis not present

## 2019-01-03 DIAGNOSIS — R972 Elevated prostate specific antigen [PSA]: Secondary | ICD-10-CM | POA: Diagnosis not present

## 2019-01-03 DIAGNOSIS — N401 Enlarged prostate with lower urinary tract symptoms: Secondary | ICD-10-CM | POA: Diagnosis not present

## 2019-03-01 ENCOUNTER — Other Ambulatory Visit: Payer: Self-pay | Admitting: Urology

## 2019-03-01 DIAGNOSIS — R972 Elevated prostate specific antigen [PSA]: Secondary | ICD-10-CM

## 2019-03-30 ENCOUNTER — Ambulatory Visit
Admission: RE | Admit: 2019-03-30 | Discharge: 2019-03-30 | Disposition: A | Discharge status: Home / Self Care | Payer: 59 | Source: Ambulatory Visit | Attending: Urology | Admitting: Urology

## 2019-03-30 DIAGNOSIS — R972 Elevated prostate specific antigen [PSA]: Secondary | ICD-10-CM

## 2019-03-30 IMAGING — MR MR PROSTATE WO/W CM
56 series · 56 of 56 positions shown · IV contrast (Multihance 13ml)
Comparison: Prostate MRI [DATE]

CLINICAL DATA: Elevated PSA PSA equal 7.53 on [DATE]. Prostate
biopsy [DATE] and [DATE]

EXAM:
MR PROSTATE WITHOUT AND WITH CONTRAST
TECHNIQUE: Multiplanar multisequence MRI images were obtained of the pelvis
centered about the prostate. Pre and post contrast images were
obtained.
CONTRAST:  13mL MULTIHANCE GADOBENATE DIMEGLUMINE 529 MG/ML IV SOLN

[Series 3: T1 · axial · 8.0mm · 1.06mm/px · 1 of 28 slices shown (1 of 2)]
[im 1/28]
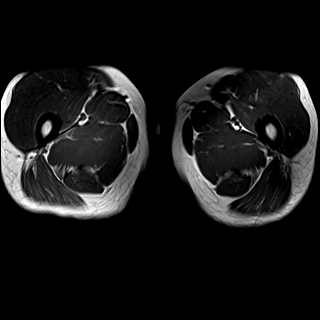

[Series 4: bSSFP fat-sat · axial · 8.0mm · 0.74mm/px · 1 of 28 slices shown]
[im 1/28]
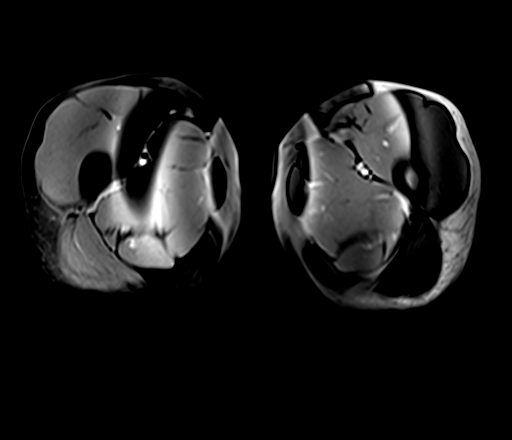

[Series 5: T2 · sagittal · 3.5mm · 0.56mm/px · 1 of 39 slices shown (1 of 4)]
[im 1/39]
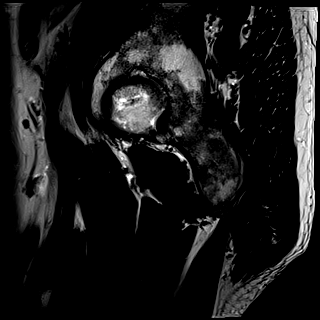

[Series 6: T1 · axial · 3.0mm · 0.31mm/px · 1 of 24 slices shown (2 of 2)]
[im 1/24]
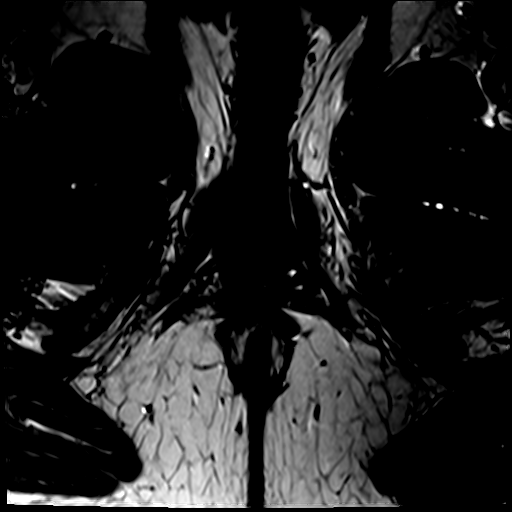

[Series 7: T2 · axial · 3.5mm · 0.56mm/px · 1 of 23 slices shown (2 of 4)]
[im 1/23]
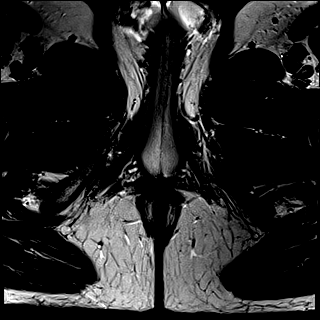

[Series 8: T2 · axial · 1.0mm · 1.04mm/px · 1 of 80 slices shown (3 of 4)]
[im 1/80]
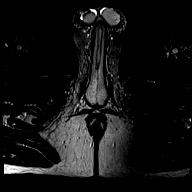

[Series 9: T2 · coronal · 3.5mm · 0.56mm/px · 1 of 23 slices shown (4 of 4)]
[im 1/23]
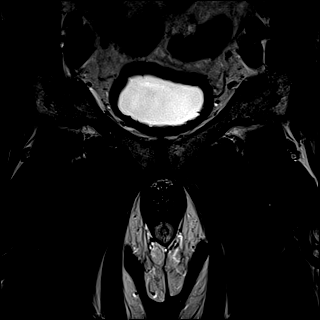

[Series 10: DWI · axial · 3.5mm · 1.56mm/px · 1 of 60 slices shown (1 of 2)]
[im 1/60]
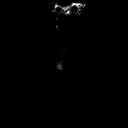

[Series 11: DWI · axial · 3.5mm · 1.56mm/px · 1 of 20 slices shown (2 of 2)]
[im 1/20]
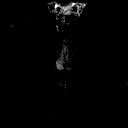

[Series 12: pre t1_twist_tra_dyn_ttc=5.3s · axial · non-contrast · 3.5mm · 0.83mm/px · 1 of 20 slices shown]
[im 1/20]
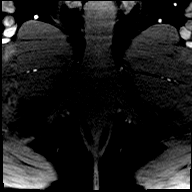

[Series 13: post t1_twist_tra_dyn-copy center · axial · 3.5mm · 0.83mm/px · 1 of 20 slices shown (1 of 24)]
[im 1/20]
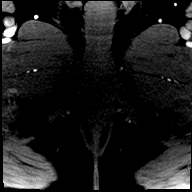

[Series 14: post t1_twist_tra_dyn-copy center · axial · 3.5mm · 0.83mm/px · 1 of 20 slices shown (2 of 24)]
[im 1/20]
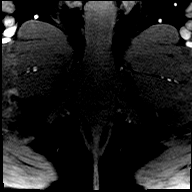

[Series 16: post t1_twist_tra_dyn-copy center · axial · 3.5mm · 0.83mm/px · 1 of 20 slices shown (3 of 24)]
[im 1/20]
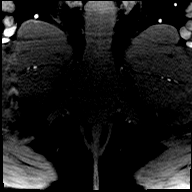

[Series 17: post t1_twist_tra_dyn-copy cent_sub_ttc=(id) · axial · 3.5mm · 0.83mm/px · 1 of 20 slices shown (1 of 22)]
[im 1/20]
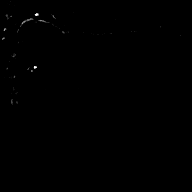

[Series 18: post t1_twist_tra_dyn-copy center · axial · 3.5mm · 0.83mm/px · 1 of 20 slices shown (4 of 24)]
[im 1/20]
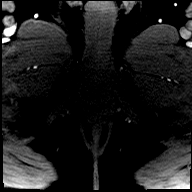

[Series 19: post t1_twist_tra_dyn-copy cent_sub_ttc=(id) · axial · 3.5mm · 0.83mm/px · 1 of 20 slices shown (2 of 22)]
[im 1/20]
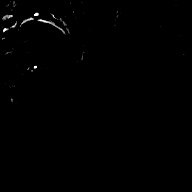

[Series 20: post t1_twist_tra_dyn-copy center · axial · 3.5mm · 0.83mm/px · 1 of 20 slices shown (5 of 24)]
[im 1/20]
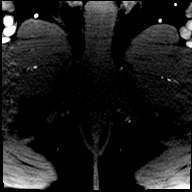

[Series 21: post t1_twist_tra_dyn-copy cent_sub_ttc=(id) · axial · 3.5mm · 0.83mm/px · 1 of 20 slices shown (3 of 22)]
[im 1/20]
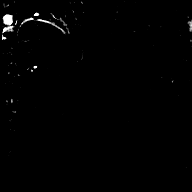

[Series 22: post t1_twist_tra_dyn-copy center · axial · 3.5mm · 0.83mm/px · 1 of 20 slices shown (6 of 24)]
[im 1/20]
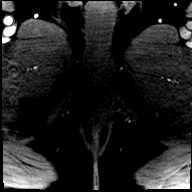

[Series 23: post t1_twist_tra_dyn-copy cent_sub_ttc=(id) · axial · 3.5mm · 0.83mm/px · 1 of 20 slices shown (4 of 22)]
[im 1/20]
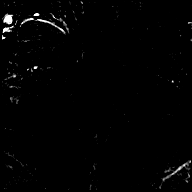

[Series 24: post t1_twist_tra_dyn-copy center · axial · 3.5mm · 0.83mm/px · 1 of 20 slices shown (7 of 24)]
[im 1/20]
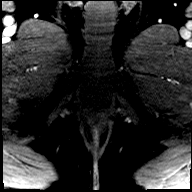

[Series 25: post t1_twist_tra_dyn-copy cent_sub_ttc=(id) · axial · 3.5mm · 0.83mm/px · 1 of 20 slices shown (5 of 22)]
[im 1/20]
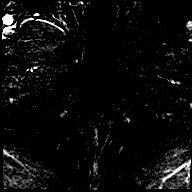

[Series 26: post t1_twist_tra_dyn-copy center · axial · 3.5mm · 0.83mm/px · 1 of 20 slices shown (8 of 24)]
[im 1/20]
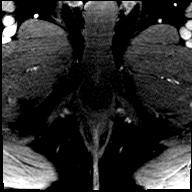

[Series 27: post t1_twist_tra_dyn-copy cent_sub_ttc=(id) · axial · 3.5mm · 0.83mm/px · 1 of 20 slices shown (6 of 22)]
[im 1/20]
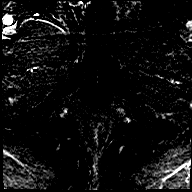

[Series 28: post t1_twist_tra_dyn-copy center · axial · 3.5mm · 0.83mm/px · 1 of 20 slices shown (9 of 24)]
[im 1/20]
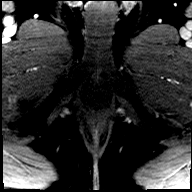

[Series 29: post t1_twist_tra_dyn-copy cent_sub_ttc=(id) · axial · 3.5mm · 0.83mm/px · 1 of 20 slices shown (7 of 22)]
[im 1/20]
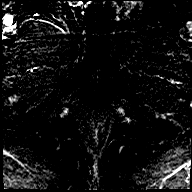

[Series 30: post t1_twist_tra_dyn-copy center · axial · 3.5mm · 0.83mm/px · 1 of 20 slices shown (10 of 24)]
[im 1/20]
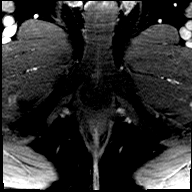

[Series 31: post t1_twist_tra_dyn-copy cent_sub_ttc=(id) · axial · 3.5mm · 0.83mm/px · 1 of 20 slices shown (8 of 22)]
[im 1/20]
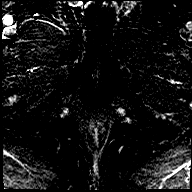

[Series 32: post t1_twist_tra_dyn-copy center · axial · 3.5mm · 0.83mm/px · 1 of 20 slices shown (11 of 24)]
[im 1/20]
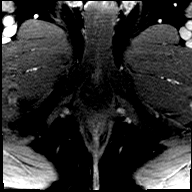

[Series 33: post t1_twist_tra_dyn-copy cent_sub_ttc=(id) · axial · 3.5mm · 0.83mm/px · 1 of 20 slices shown (9 of 22)]
[im 1/20]
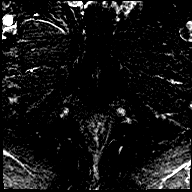

[Series 34: post t1_twist_tra_dyn-copy center · axial · 3.5mm · 0.83mm/px · 1 of 20 slices shown (12 of 24)]
[im 1/20]
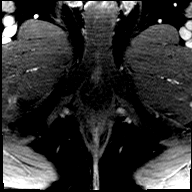

[Series 35: post t1_twist_tra_dyn-copy cent_sub_ttc=(id) · axial · 3.5mm · 0.83mm/px · 1 of 20 slices shown (10 of 22)]
[im 1/20]
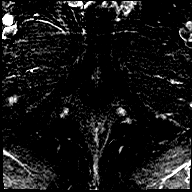

[Series 36: post t1_twist_tra_dyn-copy center · axial · 3.5mm · 0.83mm/px · 1 of 20 slices shown (13 of 24)]
[im 1/20]
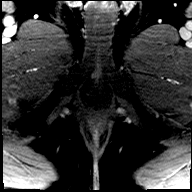

[Series 37: post t1_twist_tra_dyn-copy cent_sub_ttc=(id) · axial · 3.5mm · 0.83mm/px · 1 of 20 slices shown (11 of 22)]
[im 1/20]
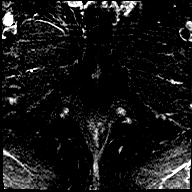

[Series 38: post t1_twist_tra_dyn-copy center · axial · 3.5mm · 0.83mm/px · 1 of 20 slices shown (14 of 24)]
[im 1/20]
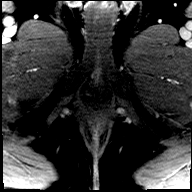

[Series 39: post t1_twist_tra_dyn-copy cent_sub_ttc=(id) · axial · 3.5mm · 0.83mm/px · 1 of 20 slices shown (12 of 22)]
[im 1/20]
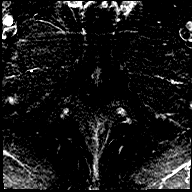

[Series 40: post t1_twist_tra_dyn-copy center · axial · 3.5mm · 0.83mm/px · 1 of 20 slices shown (15 of 24)]
[im 1/20]
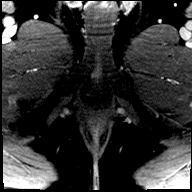

[Series 41: post t1_twist_tra_dyn-copy cent_sub_ttc=(id) · axial · 3.5mm · 0.83mm/px · 1 of 20 slices shown (13 of 22)]
[im 1/20]
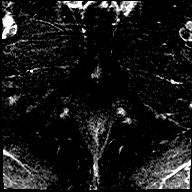

[Series 42: post t1_twist_tra_dyn-copy center · axial · 3.5mm · 0.83mm/px · 1 of 20 slices shown (16 of 24)]
[im 1/20]
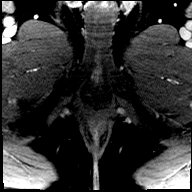

[Series 43: post t1_twist_tra_dyn-copy cent_sub_ttc=(id) · axial · 3.5mm · 0.83mm/px · 1 of 20 slices shown (14 of 22)]
[im 1/20]
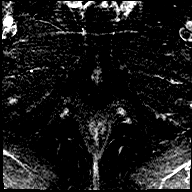

[Series 44: post t1_twist_tra_dyn-copy center · axial · 3.5mm · 0.83mm/px · 1 of 20 slices shown (17 of 24)]
[im 1/20]
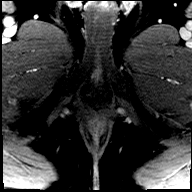

[Series 45: post t1_twist_tra_dyn-copy cent_sub_ttc=(id) · axial · 3.5mm · 0.83mm/px · 1 of 20 slices shown (15 of 22)]
[im 1/20]
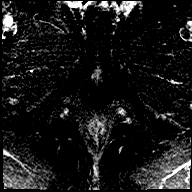

[Series 46: post t1_twist_tra_dyn-copy center · axial · 3.5mm · 0.83mm/px · 1 of 20 slices shown (18 of 24)]
[im 1/20]
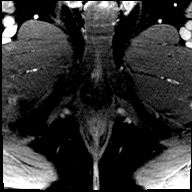

[Series 47: post t1_twist_tra_dyn-copy cent_sub_ttc=(id) · axial · 3.5mm · 0.83mm/px · 1 of 20 slices shown (16 of 22)]
[im 1/20]
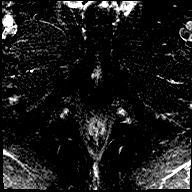

[Series 48: post t1_twist_tra_dyn-copy center · axial · 3.5mm · 0.83mm/px · 1 of 20 slices shown (19 of 24)]
[im 1/20]
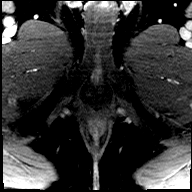

[Series 49: post t1_twist_tra_dyn-copy cent_sub_ttc=(id) · axial · 3.5mm · 0.83mm/px · 1 of 20 slices shown (17 of 22)]
[im 1/20]
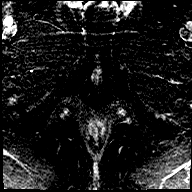

[Series 50: post t1_twist_tra_dyn-copy center · axial · 3.5mm · 0.83mm/px · 1 of 20 slices shown (20 of 24)]
[im 1/20]
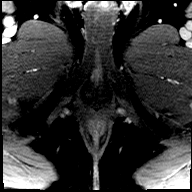

[Series 51: post t1_twist_tra_dyn-copy cent_sub_ttc=(id) · axial · 3.5mm · 0.83mm/px · 1 of 20 slices shown (18 of 22)]
[im 1/20]
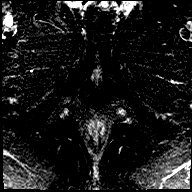

[Series 52: post t1_twist_tra_dyn-copy center · axial · 3.5mm · 0.83mm/px · 1 of 20 slices shown (21 of 24)]
[im 1/20]
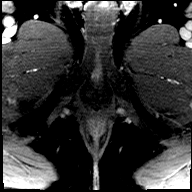

[Series 53: post t1_twist_tra_dyn-copy cent_sub_ttc=(id) · axial · 3.5mm · 0.83mm/px · 1 of 20 slices shown (19 of 22)]
[im 1/20]
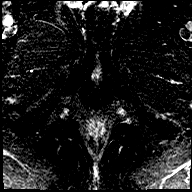

[Series 54: post t1_twist_tra_dyn-copy center · axial · 3.5mm · 0.83mm/px · 1 of 20 slices shown (22 of 24)]
[im 1/20]
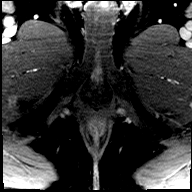

[Series 55: post t1_twist_tra_dyn-copy cent_sub_ttc=(id) · axial · 3.5mm · 0.83mm/px · 1 of 20 slices shown (20 of 22)]
[im 1/20]
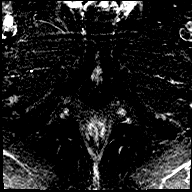

[Series 56: post t1_twist_tra_dyn-copy center · axial · 3.5mm · 0.83mm/px · 1 of 20 slices shown (23 of 24)]
[im 1/20]
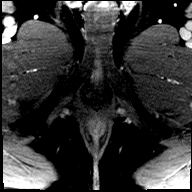

[Series 57: post t1_twist_tra_dyn-copy cent_sub_ttc=(id) · axial · 3.5mm · 0.83mm/px · 1 of 20 slices shown (21 of 22)]
[im 1/20]
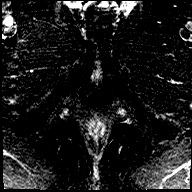

[Series 58: post t1_twist_tra_dyn-copy center · axial · 3.5mm · 0.83mm/px · 1 of 20 slices shown (24 of 24)]
[im 1/20]
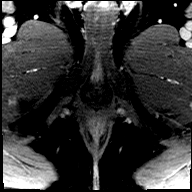

[Series 59: post t1_twist_tra_dyn-copy cent_sub_ttc=(id) · axial · 3.5mm · 0.83mm/px · 1 of 20 slices shown (22 of 22)]
[im 1/20]
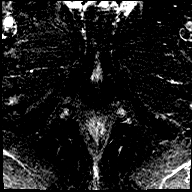

[56 of 56 positions shown; findings below may reference images not displayed]

FINDINGS: Prostate: Mild heterogeneity of signal intensity in the peripheral
zone on T2 weighted imaging. No focal lesion. No foci of restricted
diffusion within the peripheral zone.

The transitional zone is mildly nodular with capsulated nodules. No
suspicious findings.

Prostatic capsule intact.  Seminal vesicles normal

Volume: 3.1 x 2.6 by 4.4 cm

Transcapsular spread:  Absent

Seminal vesicle involvement: Absent

Neurovascular bundle involvement: Absent

Pelvic adenopathy: Absent

Bone metastasis: Absent absent

Other findings: None
IMPRESSION: 1. No high-grade carcinoma within the peripheral zone.
2. Mildly nodular transitional zone consistent with benign prostate
hypertrophy ( PI-RADS: 1).

## 2019-03-30 MED ORDER — GADOBENATE DIMEGLUMINE 529 MG/ML IV SOLN
13.0000 mL | Freq: Once | INTRAVENOUS | Status: AC | PRN
  Administered 2019-03-30: 13 mL via INTRAVENOUS

## 2019-12-26 ENCOUNTER — Other Ambulatory Visit: Payer: Self-pay

## 2019-12-26 ENCOUNTER — Ambulatory Visit: Payer: BC Managed Care – PPO | Admitting: Dermatology

## 2019-12-26 DIAGNOSIS — L814 Other melanin hyperpigmentation: Secondary | ICD-10-CM

## 2019-12-26 DIAGNOSIS — L820 Inflamed seborrheic keratosis: Secondary | ICD-10-CM

## 2019-12-26 DIAGNOSIS — L821 Other seborrheic keratosis: Secondary | ICD-10-CM

## 2019-12-26 DIAGNOSIS — L570 Actinic keratosis: Secondary | ICD-10-CM | POA: Diagnosis not present

## 2019-12-26 DIAGNOSIS — Z1283 Encounter for screening for malignant neoplasm of skin: Secondary | ICD-10-CM

## 2019-12-26 DIAGNOSIS — L578 Other skin changes due to chronic exposure to nonionizing radiation: Secondary | ICD-10-CM

## 2019-12-26 DIAGNOSIS — L249 Irritant contact dermatitis, unspecified cause: Secondary | ICD-10-CM | POA: Diagnosis not present

## 2019-12-26 DIAGNOSIS — L239 Allergic contact dermatitis, unspecified cause: Secondary | ICD-10-CM

## 2019-12-26 DIAGNOSIS — D229 Melanocytic nevi, unspecified: Secondary | ICD-10-CM

## 2019-12-26 DIAGNOSIS — B009 Herpesviral infection, unspecified: Secondary | ICD-10-CM | POA: Diagnosis not present

## 2019-12-26 DIAGNOSIS — D1800 Hemangioma unspecified site: Secondary | ICD-10-CM

## 2019-12-26 MED ORDER — VALACYCLOVIR HCL 1 G PO TABS: ORAL_TABLET | ORAL | 11 refills | Status: DC

## 2019-12-26 NOTE — Patient Instructions (Signed)

## 2019-12-26 NOTE — Progress Notes (Signed)
Follow-Up Visit   Subjective  Steven Calderon is a 64 y.o. male who presents for the following: Annual Exam and Other (Spot on right hand x 3-4 months).  The patient presents for skin cancer screening, mole check, and total-body skin examination today.  The following portions of the chart were reviewed this encounter and updated as appropriate:  Tobacco  Allergies  Meds  Problems  Med Hx  Surg Hx  Fam Hx      Review of Systems:  No other skin or systemic complaints except as noted in HPI or Assessment and Plan.  Objective  Well appearing patient in no apparent distress; mood and affect are within normal limits.  A full examination was performed including scalp, head, eyes, ears, nose, lips, neck, chest, axillae, abdomen, back, buttocks, bilateral upper extremities, bilateral lower extremities, hands, feet, fingers, toes, fingernails, and toenails. All findings within normal limits unless otherwise noted below.  Objective  Left Hand - Posterior, Right Hand - Posterior (2): Erythematous thin papules/macules with gritty scale.   Objective  Abdomen: Pink patches.  Objective  lips: Fissure of right lower lip, otherwise clear today.  Images      Objective  Right Hand - Posterior: Erythematous keratotic or waxy stuck-on papule or plaque.    Assessment & Plan    Skin cancer screening performed today.  Actinic Damage - diffuse scaly erythematous macules with underlying dyspigmentation - Recommend daily broad spectrum sunscreen SPF 30+ to sun-exposed areas, reapply every 2 hours as needed.  - Call for new or changing lesions.  Seborrheic Keratoses - Stuck-on, waxy, tan-brown papules and plaques  - Discussed benign etiology and prognosis. - Observe - Call for any changes   Lentigines - Scattered tan macules - Discussed due to sun exposure - Benign, observe - Call for any changes  Hemangiomas - Red papules - Discussed benign nature - Observe - Call  for any changes  Melanocytic Nevi - Tan-brown and/or pink-flesh-colored symmetric macules and papules - Benign appearing on exam today - Observation - Call clinic for new or changing moles - Recommend daily use of broad spectrum spf 30+ sunscreen to sun-exposed areas.     AK (actinic keratosis) (3) Left Hand - Posterior; Right Hand - Posterior (2)  Destruction of lesion - Left Hand - Posterior, Right Hand - Posterior Complexity: simple   Destruction method: cryotherapy   Informed consent: discussed and consent obtained   Timeout:  patient name, date of birth, surgical site, and procedure verified Lesion destroyed using liquid nitrogen: Yes   Region frozen until ice ball extended beyond lesion: Yes   Outcome: patient tolerated procedure well with no complications   Post-procedure details: wound care instructions given    Irritant dermatitis Abdomen At site of laparotomy incisions from Prostate surgery Recommend OTC Hydrocortisone cream qd-bid until clear.  Herpes simplex lips  Advised patient increased stress, sun exposure; trauma, can be triggers for fever blisters.  valACYclovir (VALTREX) 1000 MG tablet - lips  Cheilitis of R lower lip with fissure.  Does not appear to be Cancer or PreCancer today.  If does not resolve, will need biopsy.  Return in 2 mos if not healed or if recurs.  Photo taken.  Inflamed seborrheic keratosis Right Hand - Posterior  Destruction of lesion - Right Hand - Posterior Complexity: simple   Destruction method: cryotherapy   Informed consent: discussed and consent obtained   Timeout:  patient name, date of birth, surgical site, and procedure verified Lesion destroyed using  liquid nitrogen: Yes   Region frozen until ice ball extended beyond lesion: Yes   Outcome: patient tolerated procedure well with no complications   Post-procedure details: wound care instructions given  NO CHARGE FOR Inflamed SK   Return in about 1 year (around  12/25/2020).   I, Ashok Cordia, CMA, am acting as scribe for Sarina Ser, MD .   Documentation: I have reviewed the above documentation for accuracy and completeness, and I agree with the above.  Sarina Ser, MD

## 2019-12-27 ENCOUNTER — Encounter: Payer: Self-pay | Admitting: Dermatology

## 2020-06-26 ENCOUNTER — Ambulatory Visit: Payer: BC Managed Care – PPO

## 2020-12-24 ENCOUNTER — Ambulatory Visit (INDEPENDENT_AMBULATORY_CARE_PROVIDER_SITE_OTHER): Payer: Medicare Other | Admitting: Dermatology

## 2020-12-24 ENCOUNTER — Other Ambulatory Visit: Payer: Self-pay

## 2020-12-24 DIAGNOSIS — Z1283 Encounter for screening for malignant neoplasm of skin: Secondary | ICD-10-CM

## 2020-12-24 DIAGNOSIS — Z872 Personal history of diseases of the skin and subcutaneous tissue: Secondary | ICD-10-CM

## 2020-12-24 DIAGNOSIS — L578 Other skin changes due to chronic exposure to nonionizing radiation: Secondary | ICD-10-CM | POA: Diagnosis not present

## 2020-12-24 DIAGNOSIS — D229 Melanocytic nevi, unspecified: Secondary | ICD-10-CM

## 2020-12-24 DIAGNOSIS — L814 Other melanin hyperpigmentation: Secondary | ICD-10-CM

## 2020-12-24 DIAGNOSIS — L821 Other seborrheic keratosis: Secondary | ICD-10-CM

## 2020-12-24 DIAGNOSIS — D1800 Hemangioma unspecified site: Secondary | ICD-10-CM

## 2020-12-24 NOTE — Progress Notes (Signed)
   Follow-Up Visit   Subjective  Steven Calderon is a 65 y.o. male who presents for the following: Annual Exam (TBSE today - No history of skin cancer or abnormal moles). The patient presents for Total-Body Skin Exam (TBSE) for skin cancer screening and mole check.  The following portions of the chart were reviewed this encounter and updated as appropriate:   Tobacco  Allergies  Meds  Problems  Med Hx  Surg Hx  Fam Hx     Review of Systems:  No other skin or systemic complaints except as noted in HPI or Assessment and Plan.  Objective  Well appearing patient in no apparent distress; mood and affect are within normal limits.  A full examination was performed including scalp, head, eyes, ears, nose, lips, neck, chest, axillae, abdomen, back, buttocks, bilateral upper extremities, bilateral lower extremities, hands, feet, fingers, toes, fingernails, and toenails. All findings within normal limits unless otherwise noted below.  Objective  Head - Anterior (Face): Clear today   Assessment & Plan    Lentigines - Scattered tan macules - Due to sun exposure - Benign-appering, observe - Recommend daily broad spectrum sunscreen SPF 30+ to sun-exposed areas, reapply every 2 hours as needed. - Call for any changes  Seborrheic Keratoses - Stuck-on, waxy, tan-brown papules and/or plaques  - Benign-appearing - Discussed benign etiology and prognosis. - Observe - Call for any changes  Melanocytic Nevi - Tan-brown and/or pink-flesh-colored symmetric macules and papules - Benign appearing on exam today - Observation - Call clinic for new or changing moles - Recommend daily use of broad spectrum spf 30+ sunscreen to sun-exposed areas.   Hemangiomas - Red papules - Discussed benign nature - Observe - Call for any changes  Actinic Damage - Chronic condition, secondary to cumulative UV/sun exposure - diffuse scaly erythematous macules with underlying dyspigmentation - Recommend  daily broad spectrum sunscreen SPF 30+ to sun-exposed areas, reapply every 2 hours as needed.  - Staying in the shade or wearing long sleeves, sun glasses (UVA+UVB protection) and wide brim hats (4-inch brim around the entire circumference of the hat) are also recommended for sun protection.  - Call for new or changing lesions.  Skin cancer screening performed today.  History of actinic keratoses Head - Anterior (Face)  Clear. Observe for recurrence. Call clinic for new or changing lesions.  Recommend regular skin exams, daily broad-spectrum spf 30+ sunscreen use, and photoprotection.     Return in about 1 year (around 12/24/2021).   I, Ashok Cordia, CMA, am acting as scribe for Sarina Ser, MD .  Documentation: I have reviewed the above documentation for accuracy and completeness, and I agree with the above.  Sarina Ser, MD

## 2020-12-24 NOTE — Patient Instructions (Signed)

## 2020-12-27 ENCOUNTER — Encounter: Payer: Self-pay | Admitting: Dermatology

## 2021-04-04 ENCOUNTER — Emergency Department
Admission: EM | Admit: 2021-04-04 | Discharge: 2021-04-04 | Disposition: A | Discharge status: Home / Self Care | Payer: Medicare Other | Attending: Emergency Medicine | Admitting: Emergency Medicine

## 2021-04-04 ENCOUNTER — Other Ambulatory Visit: Payer: Self-pay

## 2021-04-04 DIAGNOSIS — Z2914 Encounter for prophylactic rabies immune globin: Secondary | ICD-10-CM | POA: Diagnosis not present

## 2021-04-04 DIAGNOSIS — Z23 Encounter for immunization: Secondary | ICD-10-CM | POA: Diagnosis not present

## 2021-04-04 DIAGNOSIS — Z7951 Long term (current) use of inhaled steroids: Secondary | ICD-10-CM | POA: Diagnosis not present

## 2021-04-04 DIAGNOSIS — J45909 Unspecified asthma, uncomplicated: Secondary | ICD-10-CM | POA: Insufficient documentation

## 2021-04-04 DIAGNOSIS — Z203 Contact with and (suspected) exposure to rabies: Secondary | ICD-10-CM | POA: Insufficient documentation

## 2021-04-04 DIAGNOSIS — Z209 Contact with and (suspected) exposure to unspecified communicable disease: Secondary | ICD-10-CM

## 2021-04-04 MED ORDER — RABIES VACCINE, PCEC IM SUSR
1.0000 mL | Freq: Once | INTRAMUSCULAR | Status: AC
  Administered 2021-04-04: 1 mL via INTRAMUSCULAR
  Filled 2021-04-04: qty 1

## 2021-04-04 MED ORDER — RABIES IMMUNE GLOBULIN 150 UNIT/ML IM INJ
20.0000 [IU]/kg | INJECTION | Freq: Once | INTRAMUSCULAR | Status: AC
  Administered 2021-04-04: 1275 [IU] via INTRAMUSCULAR
  Filled 2021-04-04: qty 8.5

## 2021-04-04 NOTE — ED Triage Notes (Signed)
Pt to ED for assessment after having bat graze his ear on Monday. States no broken skin. Was sent by pcp

## 2021-04-04 NOTE — Discharge Instructions (Signed)
Please call [facility] urgent care in [place] or [place] to schedule a follow-up appointment on 8/7, 8/11 and 8/18 for your second third and fourth doses of your rabies vaccine

## 2021-04-04 NOTE — ED Provider Notes (Signed)
[facility] EMERGENCY DEPARTMENT Provider Note   CSN: [id number] Arrival date & time: 04/04/21  1658     History Chief Complaint  Patient presents with   bat exposure    Steven Calderon is a 65 y.o. male presents to the emergency department evaluation of bat exposure that occurred yesterday.  Patient was walking at night and a bat slipped down and landed on his left side around his neck, ear.  He is uncertain if he was bitten but is concerned due to the attack.  He spoke with his PCP who recommended coming to the ER for rabies vaccination.  Patient currently is doing well, asymptomatic.  No bleeding.  HPI     Past Medical History:  Diagnosis Date   Asthma    WELL CONTROLLED   GERD (gastroesophageal reflux disease)    Gout     There are no problems to display for this patient.   Past Surgical History:  Procedure Laterality Date   APPENDECTOMY  1987   COLONOSCOPY     X3   NASAL SINUS SURGERY     PROSTATE BIOPSY N/A 07/27/2018   Procedure: URO NAV PROSTATE BIOPSY;  Surgeon: Royston Cowper, MD;  Location: [facility] ORS;  Service: Urology;  Laterality: N/A;       No family history on file.  Social History   Tobacco Use   Smoking status: Never   Smokeless tobacco: Never  Vaping Use   Vaping Use: Never used  Substance Use Topics   Alcohol use: Yes    Comment: EVERY OTHER DAY   Drug use: Never    Home Medications Prior to Admission medications   Medication Sig Start Date End Date Taking? Authorizing Provider  acetaminophen-codeine (TYLENOL #3) 300-30 MG tablet Take 1-2 tablets by mouth every 4 (four) hours as needed for moderate pain. 07/27/18   Royston Cowper, MD  albuterol (VENTOLIN HFA) 108 (90 Base) MCG/ACT inhaler Inhale into the lungs. 09/12/19 09/11/20  [provider]  azelastine (ASTELIN) 0.1 % nasal spray Place 1 spray into both nostrils daily as needed for rhinitis or allergies.  04/18/18   [provider]   beclomethasone (QVAR) 40 MCG/ACT inhaler Inhale 2 puffs into the lungs every morning.     [provider]  cetirizine (ZYRTEC) 10 MG tablet Take 10 mg by mouth every morning.     [provider]  diphenhydramine-acetaminophen (TYLENOL PM) 25-500 MG TABS tablet Take 1 tablet by mouth at bedtime as needed (for headache).    [provider]  docusate sodium (COLACE) 100 MG capsule Take 2 capsules (200 mg total) by mouth 2 (two) times daily. 07/27/18   Royston Cowper, MD  esomeprazole (NEXIUM) 20 MG capsule Take by mouth.    [provider]  febuxostat (ULORIC) 40 MG tablet Take 40 mg by mouth every morning.     [provider]  fexofenadine (ALLEGRA) 180 MG tablet Take by mouth.    [provider]  fluticasone (FLONASE) 50 MCG/ACT nasal spray Place 2 sprays into the nose 2 (two) times daily.    [provider]  GLUCOSAMINE-CHONDROITIN PO Take 1 capsule by mouth daily.    [provider]  levofloxacin (LEVAQUIN) 500 MG tablet Take 1 tablet (500 mg total) by mouth daily. 07/27/18   Royston Cowper, MD  montelukast (SINGULAIR) 10 MG tablet Take 10 mg by mouth every evening. 06/22/18   [provider]  olmesartan (BENICAR) 20 MG tablet Take  by mouth. 06/16/19 06/15/20  [provider]  oxyCODONE-acetaminophen (PERCOCET/ROXICET) 5-325 MG tablet  07/21/19   [provider]  pantoprazole (PROTONIX) 40 MG tablet Take 40 mg by mouth every morning.     [provider]  Meade Maw 40 MCG/ACT inhaler  12/05/19   [provider]  rosuvastatin (CRESTOR) 5 MG tablet Take 5 mg by mouth every Monday, Wednesday, and Friday.    [provider]  valACYclovir (VALTREX) 1000 MG tablet 2 po with first symptoms of fever blister then 2 po 12 hours later 12/26/19   Ralene Bathe, MD    Allergies    Penicillins, Allopurinol, and Lipitor [atorvastatin calcium]  Review of Systems   Review of  Systems  Constitutional:  Negative for fever.  Musculoskeletal:  Negative for arthralgias and gait problem.  Skin:  Negative for rash.   Physical Exam Updated Vital Signs BP 130/71 (BP Location: Right Arm)   Pulse (!) 57   Temp 98.2 F (36.8 C) (Oral)   Resp 16   Ht '5\' 7"'$  (1.702 m)   Wt 64.9 kg   SpO2 98%   BMI 22.40 kg/m   Physical Exam Constitutional:      Appearance: He is well-developed.  HENT:     Head: Normocephalic and atraumatic.  Eyes:     Conjunctiva/sclera: Conjunctivae normal.  Cardiovascular:     Rate and Rhythm: Normal rate.  Pulmonary:     Effort: Pulmonary effort is normal. No respiratory distress.  Skin:    General: Skin is warm.     Findings: No rash.  Neurological:     Mental Status: He is alert and oriented to person, place, and time.  Psychiatric:        Behavior: Behavior normal.        Thought Content: Thought content normal.    ED Results / Procedures / Treatments   Labs (all labs ordered are listed, but only abnormal results are displayed) Labs Reviewed - No data to display  EKG None  Radiology No results found.  Procedures Procedures   Medications Ordered in ED Medications  rabies immune globulin (HYPERAB/KEDRAB) injection 1,275 Units (has no administration in time range)  rabies vaccine (RABAVERT) injection 1 mL (has no administration in time range)    ED Course  I have reviewed the triage vital signs and the nursing notes.  Pertinent labs & imaging results that were available during my care of the patient were reviewed by me and considered in my medical decision making (see chart for details).    MDM Rules/Calculators/A&P                         65 year old male here for initiation of rabies vaccinations.  He was exposed/attacked by a bat.  After discussions with PCP PN patient is started with his rabies vaccinations.  He understands follow-up on day 3 7 and 14 and to call [facility] urgent care in [facility] to schedule  follow-up Final Clinical Impression(s) / ED Diagnoses Final diagnoses:  Exposure to bat without known bite    Rx / DC Orders ED Discharge Orders     None        Renata Caprice 04/04/21 Nash Dimmer, MD 04/04/21 [id number]

## 2021-04-07 ENCOUNTER — Ambulatory Visit
Admission: RE | Admit: 2021-04-07 | Discharge: 2021-04-07 | Disposition: A | Discharge status: Home / Self Care | Payer: Medicare Other | Source: Ambulatory Visit | Attending: Sports Medicine | Admitting: Sports Medicine

## 2021-04-07 ENCOUNTER — Other Ambulatory Visit: Payer: Self-pay

## 2021-04-07 VITALS — BP 124/62 | HR 63 | Temp 98.3°F | Resp 16 | Ht 67.0 in | Wt 143.1 lb

## 2021-04-07 DIAGNOSIS — Z203 Contact with and (suspected) exposure to rabies: Secondary | ICD-10-CM

## 2021-04-07 MED ORDER — RABIES VACCINE, PCEC IM SUSR
1.0000 mL | Freq: Once | INTRAMUSCULAR | Status: AC
  Administered 2021-04-07: 1 mL via INTRAMUSCULAR

## 2021-04-07 NOTE — Discharge Instructions (Signed)
Patient received his Day 3 Rabies Injection in his left deltoid today 04/07/2021 at 1510.  Patient will get his Day 7 Rabies injection on 04/11/21 Thursday.  Patient then will follow-up at [facility] Urgent care for his Day 14 Rabies injection.

## 2021-04-07 NOTE — ED Provider Notes (Signed)
[facility]    CSN: [id number] Arrival date & time: 04/07/21  1400      History   Chief Complaint Chief Complaint  Patient presents with   Appointment   Rabies Injection    HPI Steven Calderon is a 65 y.o. male.   Due to the fact that he has an adverse reaction to a statin in the past, cough was unable to order the 3-day post bat exposure rabies prophylaxis.  I ordered this, however this is a nurse visit.   Past Medical History:  Diagnosis Date   Asthma    WELL CONTROLLED   GERD (gastroesophageal reflux disease)    Gout     There are no problems to display for this patient.   Past Surgical History:  Procedure Laterality Date   APPENDECTOMY  1987   COLONOSCOPY     X3   NASAL SINUS SURGERY     PROSTATE BIOPSY N/A 07/27/2018   Procedure: URO NAV PROSTATE BIOPSY;  Surgeon: Royston Cowper, MD;  Location: [facility] ORS;  Service: Urology;  Laterality: N/A;       Home Medications    Prior to Admission medications   Medication Sig Start Date End Date Taking? Authorizing Provider  beclomethasone (QVAR) 40 MCG/ACT inhaler Inhale 2 puffs into the lungs every morning.    Yes [provider]  fluticasone (FLONASE) 50 MCG/ACT nasal spray Place 2 sprays into the nose 2 (two) times daily.   Yes [provider]  GLUCOSAMINE-CHONDROITIN PO Take 1 capsule by mouth daily.   Yes [provider]  levofloxacin (LEVAQUIN) 500 MG tablet Take 1 tablet (500 mg total) by mouth daily. 07/27/18  Yes Royston Cowper, MD  olmesartan (BENICAR) 20 MG tablet Take by mouth. 06/16/19 04/07/21 Yes [provider]  omeprazole (PRILOSEC OTC) 20 MG tablet Take 20 mg by mouth daily.   Yes [provider]  rosuvastatin (CRESTOR) 5 MG tablet Take 5 mg by mouth every Monday, Wednesday, and Friday.   Yes [provider]  acetaminophen-codeine (TYLENOL #3) 300-30 MG tablet Take 1-2 tablets by mouth every 4 (four) hours as needed for moderate  pain. 07/27/18   Royston Cowper, MD  albuterol (VENTOLIN HFA) 108 (90 Base) MCG/ACT inhaler Inhale into the lungs. 09/12/19 09/11/20  [provider]  azelastine (ASTELIN) 0.1 % nasal spray Place 1 spray into both nostrils daily as needed for rhinitis or allergies.  04/18/18   [provider]  cetirizine (ZYRTEC) 10 MG tablet Take 10 mg by mouth every morning.     [provider]  diphenhydramine-acetaminophen (TYLENOL PM) 25-500 MG TABS tablet Take 1 tablet by mouth at bedtime as needed (for headache).    [provider]  docusate sodium (COLACE) 100 MG capsule Take 2 capsules (200 mg total) by mouth 2 (two) times daily. 07/27/18   Royston Cowper, MD  esomeprazole (NEXIUM) 20 MG capsule Take by mouth.    [provider]  febuxostat (ULORIC) 40 MG tablet Take 40 mg by mouth every morning.     [provider]  fexofenadine (ALLEGRA) 180 MG tablet Take by mouth.    [provider]  montelukast (SINGULAIR) 10 MG tablet Take 10 mg by mouth every evening. 06/22/18   [provider]  oxyCODONE-acetaminophen (PERCOCET/ROXICET) 5-325 MG tablet  07/21/19   [provider]  pantoprazole (PROTONIX) 40 MG tablet Take 40 mg by mouth every morning.     [provider]  Kirby Crigler  REDIHALER 40 MCG/ACT inhaler  12/05/19   [provider]  valACYclovir (VALTREX) 1000 MG tablet 2 po with first symptoms of fever blister then 2 po 12 hours later 12/26/19   Ralene Bathe, MD    Family History History reviewed. No pertinent family history.  Social History Social History   Tobacco Use   Smoking status: Never   Smokeless tobacco: Never  Vaping Use   Vaping Use: Never used  Substance Use Topics   Alcohol use: Yes    Comment: EVERY OTHER DAY   Drug use: Never     Allergies   Penicillins, Allopurinol, and Lipitor [atorvastatin calcium]   Review of Systems Review of Systems   Physical Exam Triage Vital  Signs ED Triage Vitals  Enc Vitals Group     BP 04/07/21 1457 124/62     Pulse Rate 04/07/21 1457 63     Resp 04/07/21 1457 16     Temp 04/07/21 1457 98.3 F (36.8 C)     Temp Source 04/07/21 1457 Oral     SpO2 04/07/21 1457 100 %     Weight 04/07/21 1453 143 lb 1.3 oz (64.9 kg)     Height 04/07/21 1453 '5\' 7"'$  (1.702 m)     Head Circumference --      Peak Flow --      Pain Score 04/07/21 1453 0     Pain Loc --      Pain Edu? --      Excl. in [place]? --    No data found.  Updated Vital Signs BP 124/62 (BP Location: Left Arm)   Pulse 63   Temp 98.3 F (36.8 C) (Oral)   Resp 16   Ht '5\' 7"'$  (1.702 m)   Wt 64.9 kg   SpO2 100%   BMI 22.41 kg/m   Visual Acuity Right Eye Distance:   Left Eye Distance:   Bilateral Distance:    Right Eye Near:   Left Eye Near:    Bilateral Near:     Physical Exam   UC Treatments / Results  Labs (all labs ordered are listed, but only abnormal results are displayed) Labs Reviewed - No data to display  EKG   Radiology No results found.  Procedures Procedures (including critical care time)  Medications Ordered in UC Medications  rabies vaccine (RABAVERT) injection 1 mL (1 mL Intramuscular Given 04/07/21 1509)    Initial Impression / Assessment and Plan / UC Course  I have reviewed the triage vital signs and the nursing notes.  Pertinent labs & imaging results that were available during my care of the patient were reviewed by me and considered in my medical decision making (see chart for details).    Final Clinical Impressions(s) / UC Diagnoses   Final diagnoses:  Contact with or exposure to rabies     Discharge Instructions      Patient received his Day 3 Rabies Injection in his left deltoid today 04/07/2021 at 1510.  Patient will get his Day 7 Rabies injection on 04/11/21 Thursday.  Patient then will follow-up at [facility] for his Day 14 Rabies injection.    ED Prescriptions   None    PDMP not reviewed this  encounter.   Verda Cumins, MD 04/07/21 1524

## 2021-04-07 NOTE — ED Triage Notes (Signed)
Patient is here for Day 3 rabies injection.  Patient denies any side effects from his last injection.  Patient states that he tolerated it well.

## 2021-04-19 ENCOUNTER — Other Ambulatory Visit: Payer: Self-pay

## 2021-04-19 ENCOUNTER — Emergency Department
Admission: EM | Admit: 2021-04-19 | Discharge: 2021-04-19 | Disposition: A | Discharge status: Home / Self Care | Payer: Medicare Other | Attending: Emergency Medicine | Admitting: Emergency Medicine

## 2021-04-19 DIAGNOSIS — Z79899 Other long term (current) drug therapy: Secondary | ICD-10-CM | POA: Insufficient documentation

## 2021-04-19 DIAGNOSIS — J45909 Unspecified asthma, uncomplicated: Secondary | ICD-10-CM | POA: Insufficient documentation

## 2021-04-19 DIAGNOSIS — Z2914 Encounter for prophylactic rabies immune globin: Secondary | ICD-10-CM | POA: Insufficient documentation

## 2021-04-19 DIAGNOSIS — Z203 Contact with and (suspected) exposure to rabies: Secondary | ICD-10-CM | POA: Insufficient documentation

## 2021-04-19 DIAGNOSIS — Z23 Encounter for immunization: Secondary | ICD-10-CM | POA: Insufficient documentation

## 2021-04-19 MED ORDER — RABIES VACCINE, PCEC IM SUSR
1.0000 mL | Freq: Once | INTRAMUSCULAR | Status: AC
  Administered 2021-04-19: 1 mL via INTRAMUSCULAR
  Filled 2021-04-19: qty 1

## 2021-04-19 NOTE — ED Provider Notes (Signed)
[facility] Emergency Department Provider Note   ____________________________________________   Event Date/Time   First MD Initiated Contact with Patient 04/19/21 [phone]     (approximate)  I have reviewed the triage vital signs and the nursing notes.   HISTORY  Chief Complaint Rabies Injection    HPI ZYAIR KONCZAL is a 65 y.o. male patient here for last rabies shot secondary to exposure to bat on 04/04/2021.  Patient states no reaction from previous injections.         Past Medical History:  Diagnosis Date   Asthma    WELL CONTROLLED   GERD (gastroesophageal reflux disease)    Gout     There are no problems to display for this patient.   Past Surgical History:  Procedure Laterality Date   APPENDECTOMY  1987   COLONOSCOPY     X3   NASAL SINUS SURGERY     PROSTATE BIOPSY N/A 07/27/2018   Procedure: URO NAV PROSTATE BIOPSY;  Surgeon: Royston Cowper, MD;  Location: [facility] ORS;  Service: Urology;  Laterality: N/A;    Prior to Admission medications   Medication Sig Start Date End Date Taking? Authorizing Provider  acetaminophen-codeine (TYLENOL #3) 300-30 MG tablet Take 1-2 tablets by mouth every 4 (four) hours as needed for moderate pain. 07/27/18   Royston Cowper, MD  albuterol (VENTOLIN HFA) 108 (90 Base) MCG/ACT inhaler Inhale into the lungs. 09/12/19 09/11/20  [provider]  azelastine (ASTELIN) 0.1 % nasal spray Place 1 spray into both nostrils daily as needed for rhinitis or allergies.  04/18/18   [provider]  beclomethasone (QVAR) 40 MCG/ACT inhaler Inhale 2 puffs into the lungs every morning.     [provider]  cetirizine (ZYRTEC) 10 MG tablet Take 10 mg by mouth every morning.     [provider]  diphenhydramine-acetaminophen (TYLENOL PM) 25-500 MG TABS tablet Take 1 tablet by mouth at bedtime as needed (for headache).    [provider]  docusate sodium (COLACE) 100 MG capsule Take  2 capsules (200 mg total) by mouth 2 (two) times daily. 07/27/18   Royston Cowper, MD  esomeprazole (NEXIUM) 20 MG capsule Take by mouth.    [provider]  febuxostat (ULORIC) 40 MG tablet Take 40 mg by mouth every morning.     [provider]  fexofenadine (ALLEGRA) 180 MG tablet Take by mouth.    [provider]  fluticasone (FLONASE) 50 MCG/ACT nasal spray Place 2 sprays into the nose 2 (two) times daily.    [provider]  GLUCOSAMINE-CHONDROITIN PO Take 1 capsule by mouth daily.    [provider]  levofloxacin (LEVAQUIN) 500 MG tablet Take 1 tablet (500 mg total) by mouth daily. 07/27/18   Royston Cowper, MD  montelukast (SINGULAIR) 10 MG tablet Take 10 mg by mouth every evening. 06/22/18   [provider]  olmesartan (BENICAR) 20 MG tablet Take by mouth. 06/16/19 04/07/21  [provider]  omeprazole (PRILOSEC OTC) 20 MG tablet Take 20 mg by mouth daily.    [provider]  oxyCODONE-acetaminophen (PERCOCET/ROXICET) 5-325 MG tablet  07/21/19   [provider]  pantoprazole (PROTONIX) 40 MG tablet Take 40 mg by mouth every morning.     [provider]  Meade Maw 40 MCG/ACT inhaler  12/05/19   [provider]  rosuvastatin (CRESTOR) 5 MG tablet Take 5 mg by mouth every Monday, Wednesday, and Friday.  [provider]  valACYclovir (VALTREX) 1000 MG tablet 2 po with first symptoms of fever blister then 2 po 12 hours later 12/26/19   Ralene Bathe, MD    Allergies Penicillins, Allopurinol, and Lipitor [atorvastatin calcium]  No family history on file.  Social History Social History   Tobacco Use   Smoking status: Never   Smokeless tobacco: Never  Vaping Use   Vaping Use: Never used  Substance Use Topics   Alcohol use: Yes    Comment: EVERY OTHER DAY   Drug use: Never    Review of Systems Constitutional: No fever/chills Eyes: No visual changes. ENT: No sore  throat. Cardiovascular: Denies chest pain. Respiratory: Denies shortness of breath.  Asthma. Gastrointestinal: No abdominal pain.  No nausea, no vomiting.  No diarrhea.  No constipation. Genitourinary: Negative for dysuria. Musculoskeletal: Negative for back pain. Skin: Negative for rash. Neurological: Negative for headaches, focal weakness or numbness. Endocrine: GERD and gout Allergic/Immunilogical: Penicillin, allopurinol, Lipitor. ____________________________________________   PHYSICAL EXAM:  VITAL SIGNS: ED Triage Vitals  Enc Vitals Group     BP 04/19/21 0811 (!) 142/68     Pulse Rate 04/19/21 0811 61     Resp 04/19/21 0811 16     Temp 04/19/21 0811 98.3 F (36.8 C)     Temp Source 04/19/21 0811 Oral     SpO2 04/19/21 0811 100 %     Weight 04/19/21 0812 140 lb (63.5 kg)     Height 04/19/21 0812 '5\' 7"'$  (1.702 m)     Head Circumference --      Peak Flow --      Pain Score 04/19/21 0811 0     Pain Loc --      Pain Edu? --      Excl. in [place]? --     Constitutional: Alert and oriented. Well appearing and in no acute distress. Cardiovascular: Normal rate, regular rhythm. Grossly normal heart sounds.  Good peripheral circulation. Respiratory: Normal respiratory effort.  No retractions. Lungs CTAB. Musculoskeletal: No lower extremity tenderness nor edema.  No joint effusions. Neurologic:  Normal speech and language. No gross focal neurologic deficits are appreciated. No gait instability. Skin:  Skin is warm, dry and intact. No rash noted. Psychiatric: Mood and affect are normal. Speech and behavior are normal.  ____________________________________________   LABS (all labs ordered are listed, but only abnormal results are displayed)  Labs Reviewed - No data to display ____________________________________________  EKG   ____________________________________________  RADIOLOGY I, Sable Feil, personally viewed and evaluated these images (plain radiographs) as part of  my medical decision making, as well as reviewing the written report by the radiologist.  ED MD interpretation:    Official radiology report(s): No results found.  ____________________________________________   PROCEDURES  Procedure(s) performed (including Critical Care):  Procedures   ____________________________________________   INITIAL IMPRESSION / ASSESSMENT AND PLAN / ED COURSE  As part of my medical decision making, I reviewed the following data within the [place]         Patient presents with for last rabies injection secondary to suspected exposure to rabies from a bat.      ____________________________________________   FINAL CLINICAL IMPRESSION(S) / ED DIAGNOSES  Final diagnoses:  Contact with and (suspected) exposure to rabies     ED Discharge Orders     None        Note:  This document was prepared using Dragon voice recognition software and may include unintentional dictation  errors.    Sable Feil, PA-C 04/19/21 [id number]    Blake Divine, MD 04/23/21 [phone]

## 2021-04-19 NOTE — ED Triage Notes (Signed)
Pt comes with needing last rabies shot. Pt denies any complaints.

## 2021-04-19 NOTE — Discharge Instructions (Addendum)
You have completed your rabies series today.

## 2021-07-30 ENCOUNTER — Other Ambulatory Visit: Payer: Self-pay

## 2021-07-30 DIAGNOSIS — B009 Herpesviral infection, unspecified: Secondary | ICD-10-CM

## 2021-07-30 MED ORDER — VALACYCLOVIR HCL 1 G PO TABS: ORAL_TABLET | ORAL | 11 refills | Status: DC

## 2021-07-30 NOTE — Progress Notes (Signed)
Patient called and left message that he needed a RF of his Valtrex prescription. aw

## 2021-09-13 ENCOUNTER — Other Ambulatory Visit: Payer: Self-pay | Admitting: Unknown Physician Specialty

## 2021-09-13 DIAGNOSIS — H9191 Unspecified hearing loss, right ear: Secondary | ICD-10-CM

## 2021-10-18 ENCOUNTER — Other Ambulatory Visit: Payer: BC Managed Care – PPO

## 2021-10-23 ENCOUNTER — Other Ambulatory Visit: Payer: BC Managed Care – PPO

## 2021-10-24 ENCOUNTER — Ambulatory Visit
Admission: RE | Admit: 2021-10-24 | Discharge: 2021-10-24 | Disposition: A | Discharge status: Home / Self Care | Payer: Medicare Other | Source: Ambulatory Visit | Attending: Unknown Physician Specialty | Admitting: Unknown Physician Specialty

## 2021-10-24 DIAGNOSIS — H9191 Unspecified hearing loss, right ear: Secondary | ICD-10-CM

## 2021-10-24 IMAGING — MR MR BRAIN/TEMPORAL BONE/IAC
11 of 12 series · 38 of 48 positions shown · IV contrast (13ml Multihance)
Comparison: Report from maxillofacial CT [DATE] (images
unavailable).

CLINICAL DATA: Provided history: Hearing loss of right ear,
unspecified hearing loss type. Additional history provided by
scanning technologist: Patient reports progressive hearing loss in
right ear for 12 months. History of prostate cancer in [P2].

EXAM:
MRI HEAD WITHOUT AND WITH CONTRAST
TECHNIQUE: Multiplanar, multiecho pulse sequences of the brain and surrounding
structures were obtained without and with intravenous contrast.
CONTRAST:  13mL MULTIHANCE GADOBENATE DIMEGLUMINE 529 MG/ML IV SOLN

[Series 3: T1 · sagittal · 5.0mm · 0.45mm/px · 3 of 21 slices shown (1 of 3)]
[im 1/21]
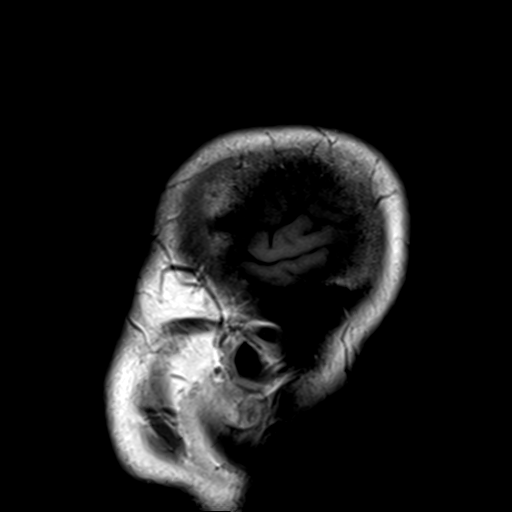
[im 11/21]
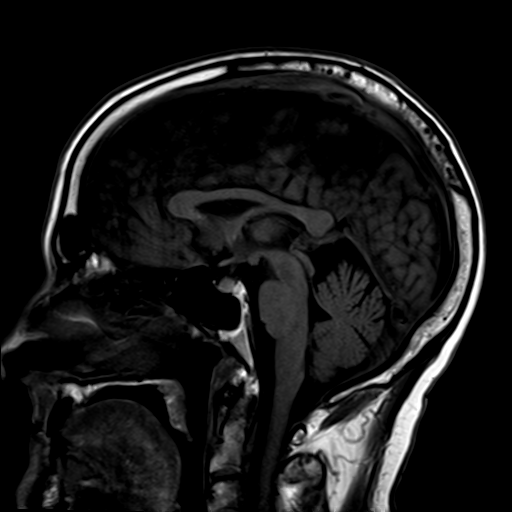
[im 21/21]
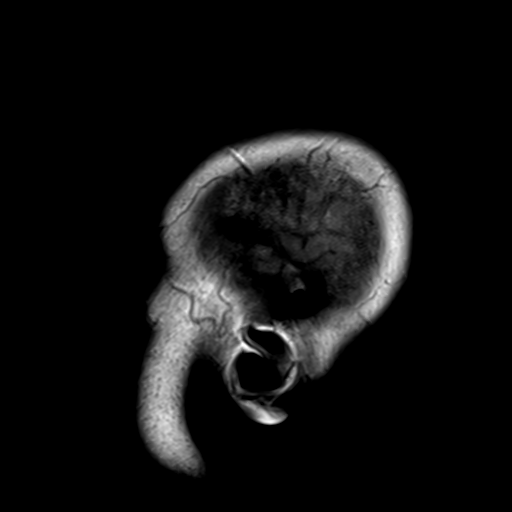

[Series 4: DWI · axial · 3.0mm · 1.80mm/px · z∈[-65,+81]mm · 11 of 100 slices shown (1 of 2)]
[im 1/100]
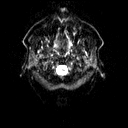
[im 10/100]
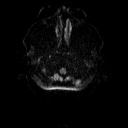
[im 20/100]
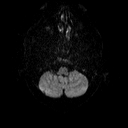
[im 30/100]
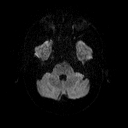
[im 40/100]
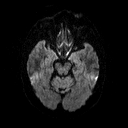
[im 50/100]
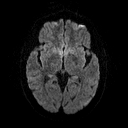
[im 60/100]
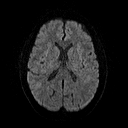
[im 70/100]
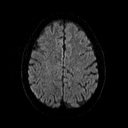
[im 80/100]
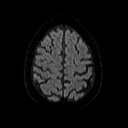
[im 90/100]
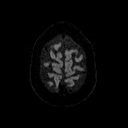
[im 100/100]
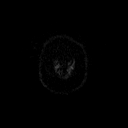

[Series 5: DWI · axial · 3.0mm · 1.80mm/px · z∈[-65,+81]mm · 5 of 49 slices shown (2 of 2)]
[im 1/49]
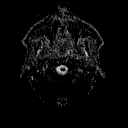
[im 13/49]
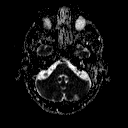
[im 25/49]
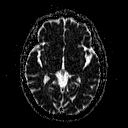
[im 37/49]
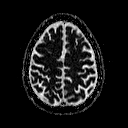
[im 49/49]
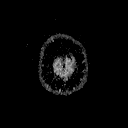

[Series 6: T2 · axial · 5.0mm · 0.45mm/px · z∈[-66,+95]mm · 3 of 26 slices shown]
[im 1/26]
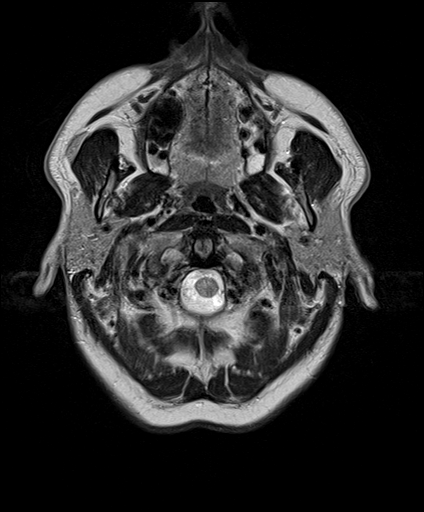
[im 13/26]
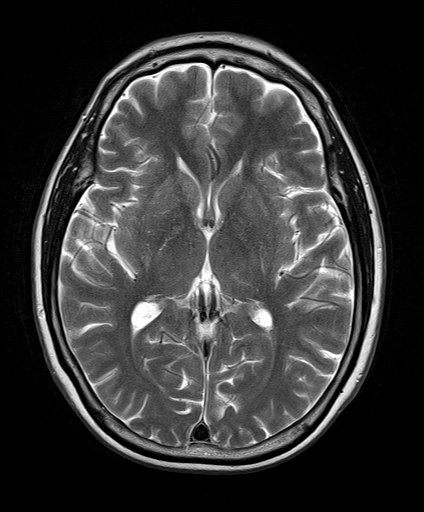
[im 26/26]
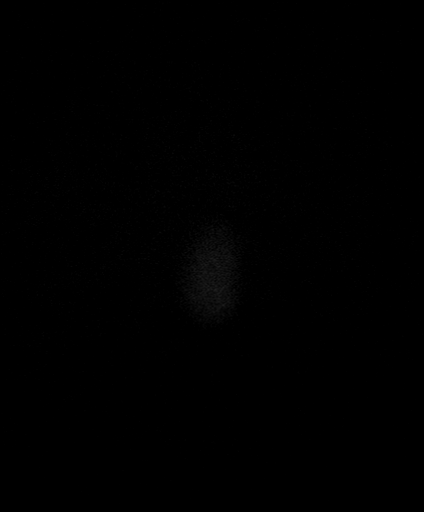

[Series 7: FLAIR · axial · 3.0mm · 0.45mm/px · z∈[-69,+98]mm · 3 of 29 slices shown]
[im 1/29]
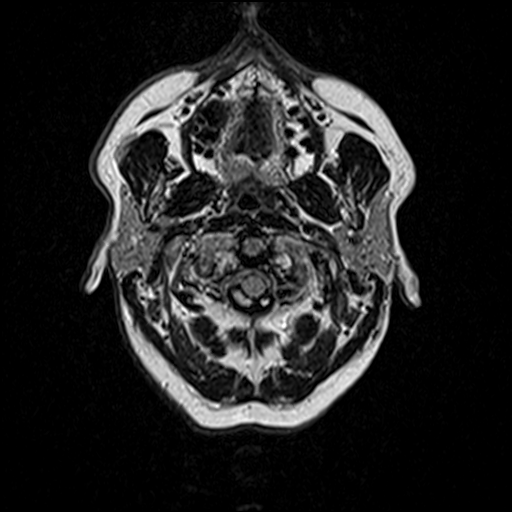
[im 15/29]
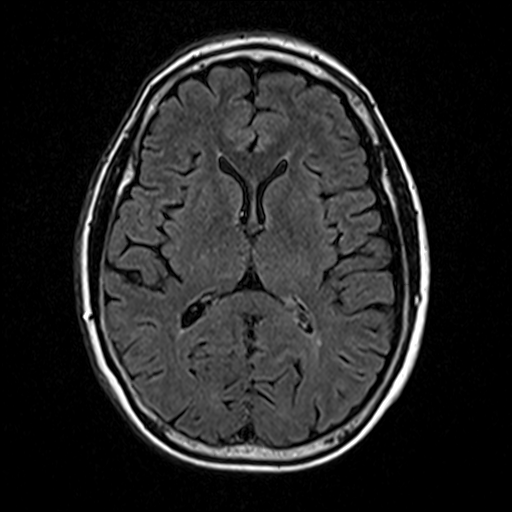
[im 29/29]
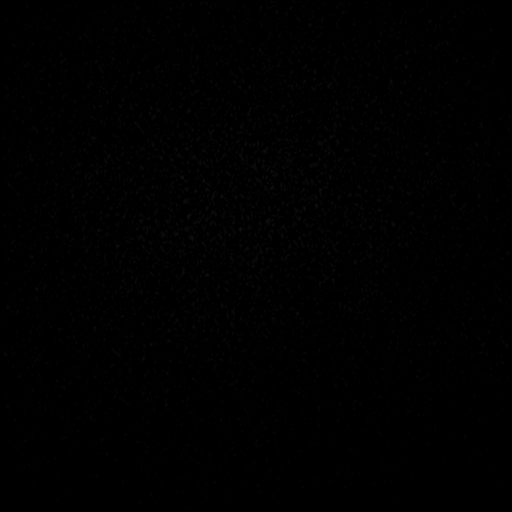

[Series 9: swi_images · axial · 3.0mm · 0.90mm/px · z∈[-61,+90]mm · 6 of 52 slices shown]
[im 1/52]
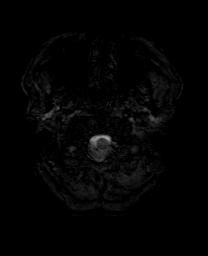
[im 11/52]
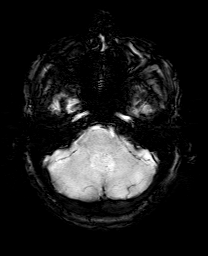
[im 21/52]
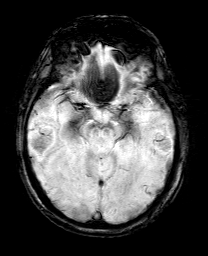
[im 31/52]
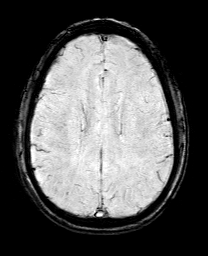
[im 41/52]
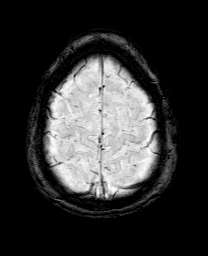
[im 52/52]
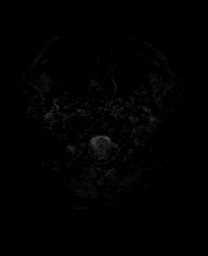

[Series 10: T1 · coronal · 3.0mm · 0.35mm/px · 1 of 11 slices shown (2 of 3)]
[im 1/11]
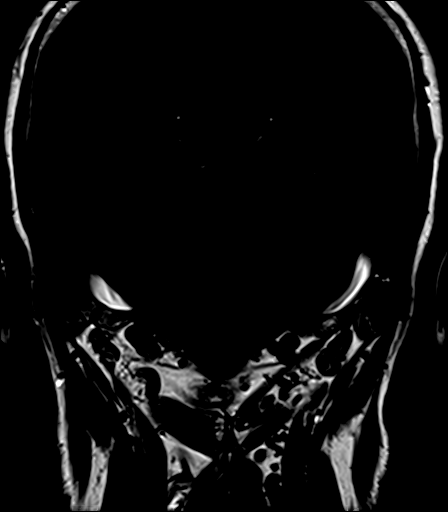

[Series 11: T1 · axial · 3.0mm · 0.35mm/px · 1 of 11 slices shown (3 of 3)]
[im 1/11]
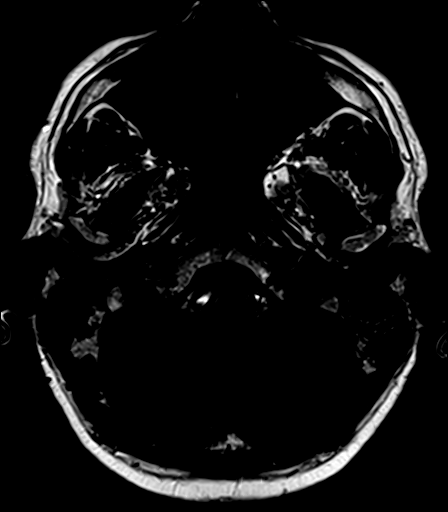

[Series 12: bSSFP · axial · 1.0mm · 0.28mm/px · z∈[-46,-24]mm · 3 of 36 slices shown]
[im 1/36]
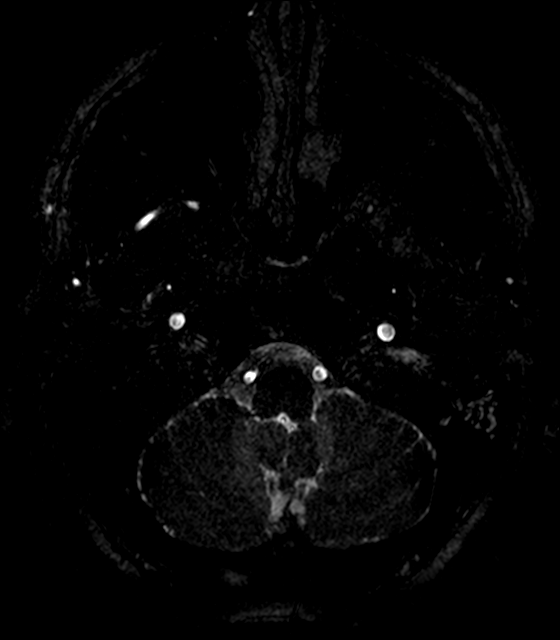
[im 12/36]
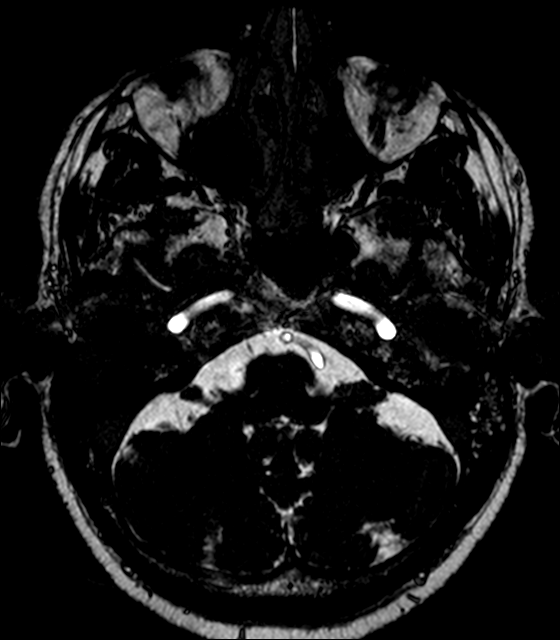
[im 24/36]
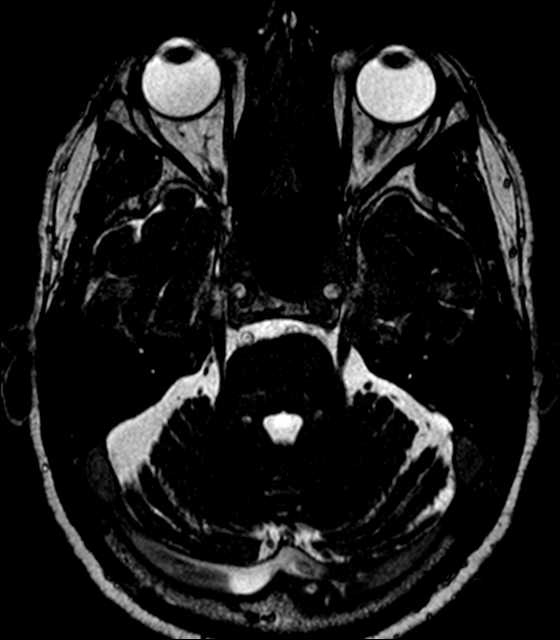

[Series 13: T1 post-contrast · coronal · 3.0mm · 0.35mm/px · 1 of 11 slices shown (1 of 2)]
[im 1/11]
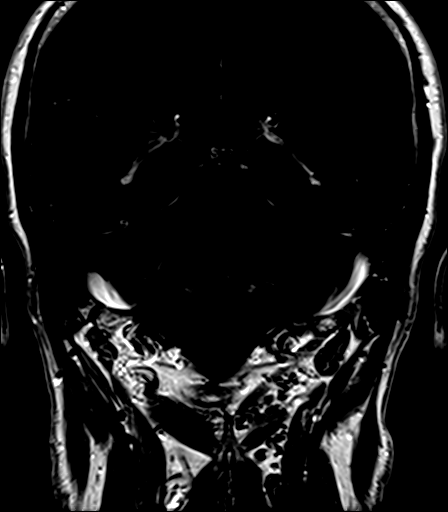

[Series 14: T1 post-contrast · axial · 3.0mm · 0.35mm/px · 1 of 11 slices shown (2 of 2)]
[im 1/11]
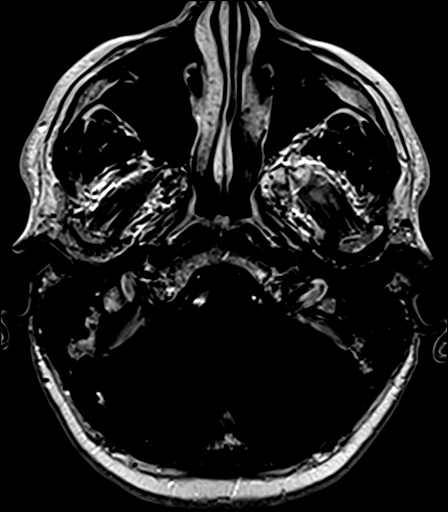

[38 of 48 positions shown; findings below may reference images not displayed]

FINDINGS: Brain:

Cerebral volume is normal.

No cortical encephalomalacia is identified. No significant cerebral
white matter disease for age.

No evidence of an intracranial mass. Specifically, no
cerebellopontine angle or internal auditory canal mass is
demonstrated. Unremarkable appearance of the 7th and 8th cranial
nerves bilaterally.

There is no acute infarct.

No chronic intracranial blood products.

No extra-axial fluid collection.

No midline shift.

No pathologic intracranial enhancement identified.

Vascular: Maintained flow voids within the proximal large arterial
vessels.

Skull and upper cervical spine: No focal suspicious marrow lesion.

Sinuses/Orbits: Visualized orbits show no acute finding. Mild
mucosal thickening within the left frontal, bilateral ethmoid and
left maxillary sinuses.

Other: Left mastoid effusion.
IMPRESSION: Unremarkable MRI appearance of the brain for age. No evidence of
acute intracranial abnormality.

No cerebellopontine angle or internal auditory canal mass.

Mild paranasal sinus disease, as described.

Left mastoid effusion.

## 2021-10-24 MED ORDER — GADOBENATE DIMEGLUMINE 529 MG/ML IV SOLN
13.0000 mL | Freq: Once | INTRAVENOUS | Status: AC | PRN
  Administered 2021-10-24: 13 mL via INTRAVENOUS

## 2021-12-26 ENCOUNTER — Ambulatory Visit (INDEPENDENT_AMBULATORY_CARE_PROVIDER_SITE_OTHER): Payer: Medicare Other | Admitting: Dermatology

## 2021-12-26 ENCOUNTER — Encounter: Payer: Self-pay | Admitting: Dermatology

## 2021-12-26 DIAGNOSIS — L814 Other melanin hyperpigmentation: Secondary | ICD-10-CM

## 2021-12-26 DIAGNOSIS — Z1283 Encounter for screening for malignant neoplasm of skin: Secondary | ICD-10-CM

## 2021-12-26 DIAGNOSIS — L739 Follicular disorder, unspecified: Secondary | ICD-10-CM

## 2021-12-26 DIAGNOSIS — B009 Herpesviral infection, unspecified: Secondary | ICD-10-CM | POA: Diagnosis not present

## 2021-12-26 DIAGNOSIS — D1800 Hemangioma unspecified site: Secondary | ICD-10-CM

## 2021-12-26 DIAGNOSIS — L570 Actinic keratosis: Secondary | ICD-10-CM

## 2021-12-26 DIAGNOSIS — L918 Other hypertrophic disorders of the skin: Secondary | ICD-10-CM

## 2021-12-26 DIAGNOSIS — L821 Other seborrheic keratosis: Secondary | ICD-10-CM

## 2021-12-26 DIAGNOSIS — L578 Other skin changes due to chronic exposure to nonionizing radiation: Secondary | ICD-10-CM

## 2021-12-26 DIAGNOSIS — L02429 Furuncle of limb, unspecified: Secondary | ICD-10-CM

## 2021-12-26 DIAGNOSIS — L02223 Furuncle of chest wall: Secondary | ICD-10-CM

## 2021-12-26 MED ORDER — DOXYCYCLINE MONOHYDRATE 100 MG PO CAPS: 100.0000 mg | ORAL_CAPSULE | Freq: Two times a day (BID) | ORAL | 2 refills | Status: DC

## 2021-12-26 MED ORDER — FLUOROURACIL 5 % EX CREA: TOPICAL_CREAM | Freq: Two times a day (BID) | CUTANEOUS | 1 refills | Status: AC

## 2021-12-26 MED ORDER — VALACYCLOVIR HCL 1 G PO TABS: ORAL_TABLET | ORAL | 11 refills | Status: DC

## 2021-12-26 MED ORDER — FLUOROURACIL 5 % EX CREA
TOPICAL_CREAM | Freq: Two times a day (BID) | CUTANEOUS | 1 refills | Status: DC
Start: 2021-12-26 — End: 2021-12-26

## 2021-12-26 NOTE — Progress Notes (Signed)
? ?Follow-Up Visit ?  ?Subjective  ?Steven Calderon is a 65 y.o. male who presents for the following: Annual Exam (Hx AK's ). The patient presents for Total-Body Skin Exam (TBSE) for skin cancer screening and mole check.  The patient has spots, moles and lesions to be evaluated, some may be new or changing and the patient has concerns that these could be cancer. ? ?The following portions of the chart were reviewed this encounter and updated as appropriate:  ? Tobacco  Allergies  Meds  Problems  Med Hx  Surg Hx  Fam Hx   ?  ?Review of Systems:  No other skin or systemic complaints except as noted in HPI or Assessment and Plan. ? ?Objective  ?Well appearing patient in no apparent distress; mood and affect are within normal limits. ? ?A full examination was performed including scalp, head, eyes, ears, nose, lips, neck, chest, axillae, abdomen, back, buttocks, bilateral upper extremities, bilateral lower extremities, hands, feet, fingers, toes, fingernails, and toenails. All findings within normal limits unless otherwise noted below. ? ?right dorsum hand x 2 (2) ?Erythematous thin papules/macules with gritty scale.  ? ?chest ?Perifollicular erythematous papules and pustules  ? ? ?Assessment & Plan  ?AK (actinic keratosis) (2) ?right dorsum hand x 2 ? ?Start 5-fluorouracil/calcipotriene cream twice a day for 7 days to affected areas including scalp and temples . Prescription sent to [facility]. Patient provided with contact information for pharmacy and advised the pharmacy will mail the prescription to their home. Patient provided with handout reviewing treatment course and side effects and advised to call or message [place] on MyChart with any concerns. ? ?Destruction of lesion - right dorsum hand x 2 ?Complexity: simple   ?Destruction method: cryotherapy   ?Informed consent: discussed and consent obtained   ?Timeout:  patient name, date of birth, surgical site, and procedure verified ?Lesion destroyed using  liquid nitrogen: Yes   ?Region frozen until ice ball extended beyond lesion: Yes   ?Outcome: patient tolerated procedure well with no complications   ?Post-procedure details: wound care instructions given   ? ?Related Medications ?fluorouracil (EFUDEX) 5 % cream ?Apply topically 2 (two) times daily. Apply to scalp and temple for 7 days. Can also use at right hand . ? 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  It is infectious and can be spread from person to person by direct contact.  It is not curable, but is treatable with topical and oral medication. ?Related Medications ?valACYclovir (VALTREX) 1000 MG tablet ?2 po with first symptoms of fever blister then 2 po 12 hours later ? ?Folliculitis - With Furunculosis = periodic "boils" ?Chest; leg ?With Furunculosis  ?Continue mupirocin ointment 2 %  - apply topically to inside of nose at bedtime for 2 weeks, then apply weekends only for 4 weeks. Can d/c after 4 weeks.  ?Start Doxycycline 100 mg capsule - 1 po bid with food and drink.  ?Doxycycline should be taken with food to prevent nausea. Do not lay down for 30 minutes after taking. Be cautious with sun exposure and use good sun protection while on this medication. Pregnant women should not take this medication.  ? ?Start CLN wash - [place] as a body wash to aa's of body 3 x weekly. Samples given in office ? ?Or  ? ?Start Hibiclens - use as  a body wash apply to aa's of body 3 x weekly. Avoid face due to potential for eye damage.   ? ?Patient given culture swab to take home and collect then next time he is flared. He can drop off here and we can send to [employer] for him.  ? ?doxycycline (MONODOX) 100 MG capsule - chest ?Take 1 capsule (100 mg total) by mouth 2 (two) times daily. Take with food and drink ? ?Lentigines ?- Scattered  tan macules ?- Due to sun exposure ?- Benign-appearing, observe ?- Recommend daily broad spectrum sunscreen SPF 30+ to sun-exposed areas, reapply every 2 hours as needed. ?- Call for any changes ? ?Acrochordons (Skin Tags) ?- Fleshy, skin-colored pedunculated papules ?- Benign appearing.  ?- Observe. ?- If desired, they can be removed with an in office procedure that is not covered by insurance. ?- Please call the clinic if you notice any new or changing lesions. ? ?Seborrheic Keratoses ?- Stuck-on, waxy, tan-brown papules and/or plaques  ?- Benign-appearing ?- Discussed benign etiology and prognosis. ?- Observe ?- Call for any changes ? ?Melanocytic Nevi ?- Tan-brown and/or pink-flesh-colored symmetric macules and papules ?- Benign appearing on exam today ?- Observation ?- Call clinic for new or changing moles ?- Recommend daily use of broad spectrum spf 30+ sunscreen to sun-exposed areas.  ? ?Hemangiomas ?- Red papules ?- Discussed benign nature ?- Observe ?- Call for any changes ? ?Actinic Damage - Severe, confluent actinic changes with pre-cancerous actinic keratoses  ?- Severe, chronic, not at goal, secondary to cumulative UV radiation exposure over time ?- diffuse scaly erythematous macules and papules with underlying dyspigmentation ?- Discussed Prescription "Field Treatment" for Severe, Chronic Confluent Actinic Changes with Pre-Cancerous Actinic Keratoses ?Field treatment involves treatment of an entire area of skin that has confluent Actinic Changes (Sun/ Ultraviolet light damage) and PreCancerous Actinic Keratoses by method of PhotoDynamic Therapy (PDT) and/or prescription Topical Chemotherapy agents such as 5-fluorouracil, 5-fluorouracil/calcipotriene, and/or imiquimod.  The purpose is to decrease the number of clinically evident and subclinical PreCancerous lesions to prevent progression to development of skin cancer by chemically destroying early precancer changes that may or may not be visible.  It  has been shown to reduce the risk of developing skin cancer in the treated area. As a result of treatment, redness, scaling, crusting, and open sores may occur during treatment course. One or more than one of these methods may be used and may have to be used several times to control, suppress and eliminate the PreCancerous changes. Discussed treatment course, expected reaction, and possible side effects. ?- Recommend daily broad spectrum sunscreen SPF 30+ to sun-exposed areas, reapply every 2 hours as needed.  ?- Staying in the shade or wearing long sleeves, sun glasses (UVA+UVB protection) and wide brim hats (4-inch brim around the entire circumference of the hat) are also recommended. ?- Call for new or changing lesions. ? ?Start 5-fluorouracil/calcipotriene cream twice a day for 7 days to affected areas including scalp  and temples can treat at right hand if needed . Prescription sent to [facility]. Patient provided with contact information for pharmacy and advised the pharmacy will mail the prescription to their home. Patient provided with handout reviewing treatment course and side effects and advised to call or message [place] on MyChart with any concerns. ? ?Skin cancer screening performed today. ? ?Return in about 1 year (around 12/27/2022) for TBSE. ? ?I, Rudell Cobb, CMA, am acting as scribe for Sarina Ser, MD . ?Documentation: I have reviewed  the above documentation for accuracy and completeness, and I agree with the above. ? ?Sarina Ser, MD ? ?

## 2021-12-26 NOTE — Patient Instructions (Addendum)
For Folliculitis  ? ?Continue Mupirocin ointment - apply to inside nose at bedtime for 2 weeks, then apply on weekends only to inside nose for 4 weeks, after 4 weeks can discontinue ? ?Start CLN wash lather and let sit on skin for several minutes and rinse. (Over the counter)  Can use 3 x weekly  ?Or  ?Start Hibiclens - wash, lather and let sit on skin for several minutes before rinsing. Do no use on face. Can use 3 x weekly  ? ?Start Doxycycline 100 mg take 1 capsule by mouth twice daily with food and drink.  ?Doxycycline should be taken with food to prevent nausea. Do not lay down for 30 minutes after taking. Be cautious with sun exposure and use good sun protection while on this medication. Pregnant women should not take this medication.  ? ? ? ?For temple / scalp / and can use at right hand ?Start 5-fluorouracil/calcipotriene cream twice a day for 7 days to affected areas including forehead and temples. Can also use at right hand.  . Prescription sent to [facility]. Patient provided with contact information for pharmacy and advised the pharmacy will mail the prescription to their home. Patient provided with handout reviewing treatment course and side effects and advised to call or message [place] on MyChart with any concerns. ? ? ?5-Fluorouracil/Calcipotriene Patient Education  ? ?Actinic keratoses are the dry, red scaly spots on the skin caused by sun damage. A portion of these spots can turn into skin cancer with time, and treating them can help prevent development of skin cancer.  ? ?Treatment of these spots requires removal of the defective skin cells. There are various ways to remove actinic keratoses, including freezing with liquid nitrogen, treatment with creams, or treatment with a blue light procedure in the office.  ? ?5-fluorouracil cream is a topical cream used to treat actinic keratoses. It works by interfering with the growth of abnormal fast-growing skin cells, such as actinic keratoses. These  cells peel off and are replaced by healthy ones.  ? ?5-fluorouracil/calcipotriene is a combination of the 5-fluorouracil cream with a vitamin D analog cream called calcipotriene. The calcipotriene alone does not treat actinic keratoses. However, when it is combined with 5-fluorouracil, it helps the 5-fluorouracil treat the actinic keratoses much faster so that the same results can be achieved with a much shorter treatment time. ? ?INSTRUCTIONS FOR 5-FLUOROURACIL/CALCIPOTRIENE CREAM:  ? ?5-fluorouracil/calcipotriene cream typically only needs to be used for 4-7 days. A thin layer should be applied twice a day to the treatment areas recommended by your physician.  ? ?If your physician prescribed you separate tubes of 5-fluourouracil and calcipotriene, apply a thin layer of 5-fluorouracil followed by a thin layer of calcipotriene.  ? ?Avoid contact with your eyes, nostrils, and mouth. Do not use 5-fluorouracil/calcipotriene cream on infected or open wounds.  ? ?You will develop redness, irritation and some crusting at areas where you have pre-cancer damage/actinic keratoses. IF YOU DEVELOP PAIN, BLEEDING, OR SIGNIFICANT CRUSTING, STOP THE TREATMENT EARLY - you have already gotten a good response and the actinic keratoses should clear up well. ? ?Wash your hands after applying 5-fluorouracil 5% cream on your skin.  ? ?A moisturizer or sunscreen with a minimum SPF 30 should be applied each morning.  ? ?Once you have finished the treatment, you can apply a thin layer of Vaseline twice a day to irritated areas to soothe and calm the areas more quickly. If you experience significant discomfort, contact your physician. ? ?  For some patients it is necessary to repeat the treatment for best results. ? ?SIDE EFFECTS: When using 5-fluorouracil/calcipotriene cream, you may have mild irritation, such as redness, dryness, swelling, or a mild burning sensation. This usually resolves within 2 weeks. The more actinic keratoses you  have, the more redness and inflammation you can expect during treatment. Eye irritation has been reported rarely. If this occurs, please let us know.  ?If you have any trouble using this cream, please call the office. If you have any other questions about this information, please do not hesitate to ask me before you leave the office. ? ?Melanoma ABCDEs ? ?Melanoma is the most dangerous type of skin cancer, and is the leading cause of death from skin disease.  You are more likely to develop melanoma if you: ?Have light-colored skin, light-colored eyes, or red or blond hair ?Spend a lot of time in the sun ?Tan regularly, either outdoors or in a tanning bed ?Have had blistering sunburns, especially during childhood ?Have a close family member who has had a melanoma ?Have atypical moles or large birthmarks ? ?Early detection of melanoma is key since treatment is typically straightforward and cure rates are extremely high if we catch it early.  ? ?The first sign of melanoma is often a change in a mole or a new dark spot.  The ABCDE system is a way of remembering the signs of melanoma. ? ?A for asymmetry:  The two halves do not match. ?B for border:  The edges of the growth are irregular. ?C for color:  A mixture of colors are present instead of an even brown color. ?D for diameter:  Melanomas are usually (but not always) greater than 31m - the size of a pencil eraser. ?E for evolution:  The spot keeps changing in size, shape, and color. ? ?Please check your skin once per month between visits. You can use a small mirror in front and a large mirror behind you to keep an eye on the back side or your body.  ? ?If you see any new or changing lesions before your next follow-up, please call to schedule a visit. ? ?Please continue daily skin protection including broad spectrum sunscreen SPF 30+ to sun-exposed areas, reapplying every 2 hours as needed when you're outdoors.  ? ?Staying in the shade or wearing long sleeves, sun  glasses (UVA+UVB protection) and wide brim hats (4-inch brim around the entire circumference of the hat) are also recommended for sun protection.   ? ? ? ? ? ? ?If You Need Anything After Your Visit ? ?If you have any questions or concerns for your doctor, please call our main line at [phone] press option 4 to reach your doctor's medical assistant. If no one answers, please leave a voicemail as directed and we will return your call as soon as possible. Messages left after 4 pm will be answered the following business day.  ? ?You may also send [place] message via MyChart. We typically respond to MyChart messages within 1-2 business days. ? ?For prescription refills, please ask your pharmacy to contact our office. Our fax number is [phone] ? ?If you have an urgent issue when the clinic is closed that cannot wait until the next business day, you can page your doctor at the number below.   ? ?Please note that while we do our best to be available for urgent issues outside of office hours, we are not available 24/7.  ? ?If you have  an urgent issue and are unable to reach [place], you may choose to seek medical care at your doctor's office, retail clinic, urgent care center, or emergency room. ? ?If you have a medical emergency, please immediately call 911 or go to the emergency department. ? ?Pager Numbers ? ?- Dr. Nehemiah Massed: [phone] ? ?- Dr. Laurence Ferrari: [phone] ? ?- Dr. Nicole Kindred: [phone] ? ?In the event of inclement weather, please call our main line at [phone] for an update on the status of any delays or closures. ? ?Dermatology Medication Tips: ?Please keep the boxes that topical medications come in in order to help keep track of the instructions about where and how to use these. Pharmacies typically print the medication instructions only on the boxes and not directly on the medication tubes.  ? ?If your medication is too expensive, please contact our office at [phone] option 4 or send [place] a  message through [place].  ? ?We are unable to tell what your co-pay for medications will be in advance as this is different depending on your insurance coverage. However, we may be able to find a substitute Runner, broadcasting/film/video

## 2022-01-07 ENCOUNTER — Encounter: Payer: Self-pay | Admitting: Dermatology

## 2022-03-14 ENCOUNTER — Other Ambulatory Visit: Payer: Self-pay | Admitting: Dermatology

## 2022-03-14 DIAGNOSIS — L739 Follicular disorder, unspecified: Secondary | ICD-10-CM

## 2023-01-01 ENCOUNTER — Ambulatory Visit (INDEPENDENT_AMBULATORY_CARE_PROVIDER_SITE_OTHER): Payer: Medicare Other | Admitting: Dermatology

## 2023-01-01 ENCOUNTER — Encounter: Payer: Self-pay | Admitting: Dermatology

## 2023-01-01 DIAGNOSIS — L578 Other skin changes due to chronic exposure to nonionizing radiation: Secondary | ICD-10-CM

## 2023-01-01 DIAGNOSIS — L814 Other melanin hyperpigmentation: Secondary | ICD-10-CM

## 2023-01-01 DIAGNOSIS — X32XXXA Exposure to sunlight, initial encounter: Secondary | ICD-10-CM

## 2023-01-01 DIAGNOSIS — Z1283 Encounter for screening for malignant neoplasm of skin: Secondary | ICD-10-CM

## 2023-01-01 DIAGNOSIS — W908XXA Exposure to other nonionizing radiation, initial encounter: Secondary | ICD-10-CM

## 2023-01-01 DIAGNOSIS — Z5111 Encounter for antineoplastic chemotherapy: Secondary | ICD-10-CM

## 2023-01-01 DIAGNOSIS — D1801 Hemangioma of skin and subcutaneous tissue: Secondary | ICD-10-CM

## 2023-01-01 DIAGNOSIS — Z7189 Other specified counseling: Secondary | ICD-10-CM

## 2023-01-01 DIAGNOSIS — Z79899 Other long term (current) drug therapy: Secondary | ICD-10-CM

## 2023-01-01 DIAGNOSIS — Z872 Personal history of diseases of the skin and subcutaneous tissue: Secondary | ICD-10-CM

## 2023-01-01 DIAGNOSIS — L821 Other seborrheic keratosis: Secondary | ICD-10-CM | POA: Diagnosis not present

## 2023-01-01 DIAGNOSIS — L570 Actinic keratosis: Secondary | ICD-10-CM | POA: Diagnosis not present

## 2023-01-01 NOTE — Patient Instructions (Addendum)
Cryotherapy Aftercare  Wash gently with soap and water everyday.   Apply Vaseline Jelly daily until healed.    - Start 5-fluorouracil/calcipotriene cream twice a day for 7 days to affected areas including scalp. Prescription sent to [facility]. Patient advised they will receive an email to purchase the medication online and have it sent to their home. Patient provided with handout reviewing treatment course and side effects and advised to call or message [place] on MyChart with any concerns. Begin the first week of June   Reviewed course of treatment and expected reaction.  Patient advised to expect inflammation and crusting and advised that erosions are possible.  Patient advised to be diligent with sun protection during and after treatment. Counseled to keep medication out of reach of children and pets.    Recommend daily broad spectrum sunscreen SPF 30+ to sun-exposed areas, reapply every 2 hours as needed. Call for new or changing lesions.  Staying in the shade or wearing long sleeves, sun glasses (UVA+UVB protection) and wide brim hats (4-inch brim around the entire circumference of the hat) are also recommended for sun protection.    Melanoma ABCDEs  Melanoma is the most dangerous type of skin cancer, and is the leading cause of death from skin disease.  You are more likely to develop melanoma if you: Have light-colored skin, light-colored eyes, or red or blond hair Spend a lot of time in the sun Tan regularly, either outdoors or in a tanning bed Have had blistering sunburns, especially during childhood Have a close family member who has had a melanoma Have atypical moles or large birthmarks  Early detection of melanoma is key since treatment is typically straightforward and cure rates are extremely high if we catch it early.   The first sign of melanoma is often a change in a mole or a new dark spot.  The ABCDE system is a way of remembering the signs of  melanoma.  A for asymmetry:  The two halves do not match. B for border:  The edges of the growth are irregular. C for color:  A mixture of colors are present instead of an even brown color. D for diameter:  Melanomas are usually (but not always) greater than 6mm - the size of a pencil eraser. E for evolution:  The spot keeps changing in size, shape, and color.  Please check your skin once per month between visits. You can use a small mirror in front and a large mirror behind you to keep an eye on the back side or your body.   If you see any new or changing lesions before your next follow-up, please call to schedule a visit.  Please continue daily skin protection including broad spectrum sunscreen SPF 30+ to sun-exposed areas, reapplying every 2 hours as needed when you're outdoors.   Staying in the shade or wearing long sleeves, sun glasses (UVA+UVB protection) and wide brim hats (4-inch brim around the entire circumference of the hat) are also recommended for sun protection.     Due to recent changes in healthcare laws, you may see results of your pathology and/or laboratory studies on MyChart before the doctors have had a chance to review them. We understand that in some cases there may be results that are confusing or concerning to you. Please understand that not all results are received at the same time and often the doctors may need to interpret multiple results in order to provide you with the best plan of  care or course of treatment. Therefore, we ask that you please give [place] 2 business days to thoroughly review all your results before contacting the office for clarification. Should we see a critical lab result, you will be contacted sooner.   If You Need Anything After Your Visit  If you have any questions or concerns for your doctor, please call our main line at [phone] and press option 4 to reach your doctor's medical assistant. If no one answers, please leave a voicemail as  directed and we will return your call as soon as possible. Messages left after 4 pm will be answered the following business day.   You may also send [place] a message via MyChart. We typically respond to MyChart messages within 1-2 business days.  For prescription refills, please ask your pharmacy to contact our office. Our fax number is [phone].  If you have an urgent issue when the clinic is closed that cannot wait until the next business day, you can page your doctor at the number below.    Please note that while we do our best to be available for urgent issues outside of office hours, we are not available 24/7.   If you have an urgent issue and are unable to reach [place], you may choose to seek medical care at your doctor's office, retail clinic, urgent care center, or emergency room.  If you have a medical emergency, please immediately call 911 or go to the emergency department.  Pager Numbers  - Dr. Gwen Pounds: [phone]  - Dr. Neale Burly: [phone]  - Dr. Roseanne Reno: [phone]  In the event of inclement weather, please call our main line at [phone] for an update on the status of any delays or closures.  Dermatology Medication Tips: Please keep the boxes that topical medications come in in order to help keep track of the instructions about where and how to use these. Pharmacies typically print the medication instructions only on the boxes and not directly on the medication tubes.   If your medication is too expensive, please contact our office at [phone] option 4 or send [place] a message through MyChart.   We are unable to tell what your co-pay for medications will be in advance as this is different depending on your insurance coverage. However, we may be able to find a substitute medication at lower cost or fill out paperwork to get insurance to cover a needed medication.   If a prior authorization is required to get your medication covered by your insurance company, please  allow [place] 1-2 business days to complete this process.  Drug prices often vary depending on where the prescription is filled and some pharmacies may offer cheaper prices.  The website www.goodrx.com contains coupons for medications through different pharmacies. The prices here do not account for what the cost may be with help from insurance (it may be cheaper with your insurance), but the website can give you the price if you did not use any insurance.  - You can print the associated coupon and take it with your prescription to the pharmacy.  - You may also stop by our office during regular business hours and pick up a GoodRx coupon card.  - If you need your prescription sent electronically to a different pharmacy, notify our office through [facility] or by phone at [phone] option 4.     Si Usted Necesita Algo Despus de Su Visita  Tambin puede enviarnos un mensaje a travs de Clinical cytogeneticist. Por lo general  respondemos a los mensajes de MyChart en el transcurso de 1 a 2 das hbiles.  Para renovar recetas, por favor pida a su farmacia que se ponga en contacto con nuestra oficina. Harland Dingwall de fax es [place] [phone].  Si tiene un asunto urgente cuando la clnica est cerrada y que no puede esperar hasta el siguiente da hbil, puede llamar/localizar a su doctor(a) al nmero que aparece a continuacin.   Por favor, tenga en cuenta que aunque hacemos todo lo posible para estar disponibles para asuntos urgentes fuera del horario de [place], no estamos disponibles las 24 horas del da, los 7 das de la [place].   Si tiene un problema urgente y no puede comunicarse con nosotros, puede optar por buscar atencin mdica  en el consultorio de su doctor(a), en una clnica privada, en un centro de atencin urgente o en una sala de emergencias.  Si tiene Engineering geologist, por favor llame inmediatamente al 911 o vaya a la sala de emergencias.  Nmeros de bper  - Dr. Nehemiah Massed:  [phone]  - Dra. Moye: [phone]  - Dra. Nicole Kindred: [phone]  En caso de inclemencias del [place], por favor llame a Johnsie Kindred principal al [phone] para una actualizacin sobre el [place] de cualquier retraso o cierre.  Consejos para la medicacin en dermatologa: Por favor, guarde las cajas en las que vienen los medicamentos de uso tpico para ayudarle a seguir las instrucciones sobre dnde y cmo usarlos. Las farmacias generalmente imprimen las instrucciones del medicamento slo en las cajas y no directamente en los tubos del [place].   Si su medicamento es muy caro, por favor, pngase en contacto con Zigmund Daniel llamando al [phone] y presione la opcin 4 o envenos un mensaje a travs de Pharmacist, community.   No podemos decirle cul ser su copago por los medicamentos por adelantado ya que esto es diferente dependiendo de la cobertura de su seguro. Sin embargo, es posible que podamos encontrar un medicamento sustituto a Electrical engineer un formulario para que el seguro cubra el medicamento que se considera necesario.   Si se requiere una autorizacin previa para que su compaa de seguros [place] su medicamento, por favor permtanos de 1 a 2 das hbiles para completar este proceso.  Los precios de los medicamentos varan con frecuencia dependiendo del Environmental consultant de dnde se surte la receta y alguna farmacias pueden ofrecer precios ms baratos.  El sitio web www.goodrx.com tiene cupones para medicamentos de Airline pilot. Los precios aqu no tienen en cuenta lo que podra costar con la ayuda del seguro (puede ser ms barato con su seguro), pero el sitio web puede darle el precio si no utiliz Research scientist (physical sciences).  - Puede imprimir el cupn correspondiente y llevarlo con su receta a la farmacia.  - Tambin puede pasar por nuestra oficina durante el horario de atencin regular y Charity fundraiser una tarjeta de cupones de GoodRx.  - Si necesita que su receta se enve electrnicamente a  una farmacia diferente, informe a nuestra oficina a travs de MyChart de [place] o por telfono llamando al [phone] y presione la opcin 4.

## 2023-01-01 NOTE — Progress Notes (Signed)
Follow-Up Visit   Subjective  Steven Calderon is a 67 y.o. male who presents for the following: Skin Cancer Screening and Full Body Skin Exam. No personal Hx of skin cancer. Hx of AK  The patient presents for Total-Body Skin Exam (TBSE) for skin cancer screening and mole check. The patient has spots, moles and lesions to be evaluated, some may be new or changing and the patient has concerns that these could be cancer.    The following portions of the chart were reviewed this encounter and updated as appropriate: medications, allergies, medical history  Review of Systems:  No other skin or systemic complaints except as noted in HPI or Assessment and Plan.  Objective  Well appearing patient in no apparent distress; mood and affect are within normal limits.  A full examination was performed including scalp, head, eyes, ears, nose, lips, neck, chest, axillae, abdomen, back, buttocks, bilateral upper extremities, bilateral lower extremities, hands, feet, fingers, toes, fingernails, and toenails. All findings within normal limits unless otherwise noted below.   Relevant physical exam findings are noted in the Assessment and Plan.    Assessment & Plan   LENTIGINES, SEBORRHEIC KERATOSES, HEMANGIOMAS - Benign normal skin lesions - Benign-appearing - Call for any changes  MELANOCYTIC NEVI - Tan-brown and/or pink-flesh-colored symmetric macules and papules - Benign appearing on exam today - Observation - Call clinic for new or changing moles - Recommend daily use of broad spectrum spf 30+ sunscreen to sun-exposed areas.    SKIN CANCER SCREENING PERFORMED TODAY.  ACTINIC KERATOSIS Exam: Erythematous thin papules/macules with gritty scale  Actinic keratoses are precancerous spots that appear secondary to cumulative UV radiation exposure/sun exposure over time. They are chronic with expected duration over 1 year. A portion of actinic keratoses will progress to squamous cell carcinoma  of the skin. It is not possible to reliably predict which spots will progress to skin cancer and so treatment is recommended to prevent development of skin cancer.  Recommend daily broad spectrum sunscreen SPF 30+ to sun-exposed areas, reapply every 2 hours as needed.  Recommend staying in the shade or wearing long sleeves, sun glasses (UVA+UVB protection) and wide brim hats (4-inch brim around the entire circumference of the hat). Call for new or changing lesions.  Treatment Plan:  Prior to procedure, discussed risks of blister formation, small wound, skin dyspigmentation, or rare scar following cryotherapy. Recommend Vaseline ointment to treated areas while healing.  Destruction Procedure Note Destruction method: cryotherapy   Informed consent: discussed and consent obtained   Lesion destroyed using liquid nitrogen: Yes   Outcome: patient tolerated procedure well with no complications   Post-procedure details: wound care instructions given   Locations: scalp x2 # of Lesions Treated: 2   ACTINIC DAMAGE WITH PRECANCEROUS ACTINIC KERATOSES Counseling for Topical Chemotherapy Management: Patient exhibits: - Severe, confluent actinic changes with pre-cancerous actinic keratoses that is secondary to cumulative UV radiation exposure over time - Condition that is severe; chronic, not at goal. - diffuse scaly erythematous macules and papules with underlying dyspigmentation - Discussed Prescription "Field Treatment" topical Chemotherapy for Severe, Chronic Confluent Actinic Changes with Pre-Cancerous Actinic Keratoses Field treatment involves treatment of an entire area of skin that has confluent Actinic Changes (Sun/ Ultraviolet light damage) and PreCancerous Actinic Keratoses by method of PhotoDynamic Therapy (PDT) and/or prescription Topical Chemotherapy agents such as 5-fluorouracil, 5-fluorouracil/calcipotriene, and/or imiquimod.  The purpose is to decrease the number of clinically evident and  subclinical PreCancerous lesions to prevent progression to development  of skin cancer by chemically destroying early precancer changes that may or may not be visible.  It has been shown to reduce the risk of developing skin cancer in the treated area. As a result of treatment, redness, scaling, crusting, and open sores may occur during treatment course. One or more than one of these methods may be used and may have to be used several times to control, suppress and eliminate the PreCancerous changes. Discussed treatment course, expected reaction, and possible side effects. - Recommend daily broad spectrum sunscreen SPF 30+ to sun-exposed areas, reapply every 2 hours as needed.  - Staying in the shade or wearing long sleeves, sun glasses (UVA+UVB protection) and wide brim hats (4-inch brim around the entire circumference of the hat) are also recommended. - Call for new or changing lesions.  - Start 5-fluorouracil/calcipotriene cream twice a day for 7 days to affected areas including scalp. Prescription sent to [facility]. Patient advised they will receive an email to purchase the medication online and have it sent to their home. Patient provided with handout reviewing treatment course and side effects and advised to call or message [place] on MyChart with any concerns. Begin the first week of June  Reviewed course of treatment and expected reaction.  Patient advised to expect inflammation and crusting and advised that erosions are possible.  Patient advised to be diligent with sun protection during and after treatment. Counseled to keep medication out of reach of children and pets.   Return in about 1 year (around 01/01/2024) for TBSE.  I, Lawson Radar, CMA, am acting as scribe for Armida Sans, MD.   Documentation: I have reviewed the above documentation for accuracy and completeness, and I agree with the above.  Armida Sans, MD

## 2023-01-07 ENCOUNTER — Encounter: Payer: Self-pay | Admitting: Dermatology

## 2024-01-07 ENCOUNTER — Ambulatory Visit: Payer: Medicare Other | Admitting: Dermatology

## 2024-02-08 ENCOUNTER — Ambulatory Visit: Admitting: Dermatology

## 2024-02-08 ENCOUNTER — Encounter: Payer: Self-pay | Admitting: Dermatology

## 2024-02-08 DIAGNOSIS — B001 Herpesviral vesicular dermatitis: Secondary | ICD-10-CM

## 2024-02-08 DIAGNOSIS — Z872 Personal history of diseases of the skin and subcutaneous tissue: Secondary | ICD-10-CM

## 2024-02-08 DIAGNOSIS — L814 Other melanin hyperpigmentation: Secondary | ICD-10-CM | POA: Diagnosis not present

## 2024-02-08 DIAGNOSIS — D1801 Hemangioma of skin and subcutaneous tissue: Secondary | ICD-10-CM

## 2024-02-08 DIAGNOSIS — W908XXA Exposure to other nonionizing radiation, initial encounter: Secondary | ICD-10-CM | POA: Diagnosis not present

## 2024-02-08 DIAGNOSIS — B009 Herpesviral infection, unspecified: Secondary | ICD-10-CM

## 2024-02-08 DIAGNOSIS — D229 Melanocytic nevi, unspecified: Secondary | ICD-10-CM

## 2024-02-08 DIAGNOSIS — Z8619 Personal history of other infectious and parasitic diseases: Secondary | ICD-10-CM

## 2024-02-08 DIAGNOSIS — L821 Other seborrheic keratosis: Secondary | ICD-10-CM

## 2024-02-08 DIAGNOSIS — Z7189 Other specified counseling: Secondary | ICD-10-CM

## 2024-02-08 DIAGNOSIS — L578 Other skin changes due to chronic exposure to nonionizing radiation: Secondary | ICD-10-CM

## 2024-02-08 DIAGNOSIS — L570 Actinic keratosis: Secondary | ICD-10-CM

## 2024-02-08 DIAGNOSIS — Z1283 Encounter for screening for malignant neoplasm of skin: Secondary | ICD-10-CM

## 2024-02-08 DIAGNOSIS — Z5111 Encounter for antineoplastic chemotherapy: Secondary | ICD-10-CM

## 2024-02-08 DIAGNOSIS — Z79899 Other long term (current) drug therapy: Secondary | ICD-10-CM

## 2024-02-08 NOTE — Patient Instructions (Addendum)
 Actinic keratoses are precancerous spots that appear secondary to cumulative UV radiation exposure/sun exposure over time. They are chronic with expected duration over 1 year. A portion of actinic keratoses will progress to squamous cell carcinoma of the skin. It is not possible to reliably predict which spots will progress to skin cancer and so treatment is recommended to prevent development of skin cancer.  Recommend daily broad spectrum sunscreen SPF 30+ to sun-exposed areas, reapply every 2 hours as needed.  Recommend staying in the shade or wearing long sleeves, sun glasses (UVA+UVB protection) and wide brim hats (4-inch brim around the entire circumference of the hat). Call for new or changing lesions.   Cryotherapy Aftercare  Wash gently with soap and water everyday.   Apply Vaseline and Band-Aid daily until healed.     Patient advised to call or send mychart if spots treated with Ln2 are not resolved in several months   Restart cream July 15 - ReStart 5-fluorouracil /calcipotriene cream twice a day for 10 days to affected areas including scalp. Prescription sent to [facility]. Patient advised they will receive an email to purchase the medication online and have it sent to their home. Patient provided with handout reviewing treatment course and side effects and advised to call or message us  on MyChart with any concerns.  Reviewed course of treatment and expected reaction.  Patient advised to expect inflammation and crusting and advised that erosions are possible.  Patient advised to be diligent with sun protection during and after treatment. Counseled to keep medication out of reach of children and pets.    5-Fluorouracil /Calcipotriene Patient Education   Actinic keratoses are the dry, red scaly spots on the skin caused by sun damage. A portion of these spots can turn into skin cancer with time, and treating them can help prevent development of skin cancer.    Treatment of these spots requires removal of the defective skin cells. There are various ways to remove actinic keratoses, including freezing with liquid nitrogen, treatment with creams, or treatment with a blue light procedure in the office.   5-fluorouracil  cream is a topical cream used to treat actinic keratoses. It works by interfering with the growth of abnormal fast-growing skin cells, such as actinic keratoses. These cells peel off and are replaced by healthy ones.   5-fluorouracil /calcipotriene is a combination of the 5-fluorouracil  cream with a vitamin D analog cream called calcipotriene. The calcipotriene alone does not treat actinic keratoses. However, when it is combined with 5-fluorouracil , it helps the 5-fluorouracil  treat the actinic keratoses much faster so that the same results can be achieved with a much shorter treatment time.  INSTRUCTIONS FOR 5-FLUOROURACIL /CALCIPOTRIENE CREAM:   5-fluorouracil /calcipotriene cream typically only needs to be used for 4-7 days. A thin layer should be applied twice a day to the treatment areas recommended by your physician.   If your physician prescribed you separate tubes of 5-fluourouracil and calcipotriene, apply a thin layer of 5-fluorouracil  followed by a thin layer of calcipotriene.   Avoid contact with your eyes, nostrils, and mouth. Do not use 5-fluorouracil /calcipotriene cream on infected or open wounds.   You will develop redness, irritation and some crusting at areas where you have pre-cancer damage/actinic keratoses. IF YOU DEVELOP PAIN, BLEEDING, OR SIGNIFICANT CRUSTING, STOP THE TREATMENT EARLY - you have already gotten a good response and the actinic keratoses should clear up well.  Wash your hands after applying 5-fluorouracil  5% cream on your skin.   A moisturizer or sunscreen with a  minimum SPF 30 should be applied each morning.   Once you have finished the treatment, you can apply a thin layer of Vaseline twice a day to  irritated areas to soothe and calm the areas more quickly. If you experience significant discomfort, contact your physician.  For some patients it is necessary to repeat the treatment for best results.  SIDE EFFECTS: When using 5-fluorouracil /calcipotriene cream, you may have mild irritation, such as redness, dryness, swelling, or a mild burning sensation. This usually resolves within 2 weeks. The more actinic keratoses you have, the more redness and inflammation you can expect during treatment. Eye irritation has been reported rarely. If this occurs, please let us  know.  If you have any trouble using this cream, please call the office. If you have any other questions about this information, please do not hesitate to ask me before you leave the office.     Melanoma ABCDEs  Melanoma is the most dangerous type of skin cancer, and is the leading cause of death from skin disease.  You are more likely to develop melanoma if you: Have light-colored skin, light-colored eyes, or red or blond hair Spend a lot of time in the sun Tan regularly, either outdoors or in a tanning bed Have had blistering sunburns, especially during childhood Have a close family member who has had a melanoma Have atypical moles or large birthmarks  Early detection of melanoma is key since treatment is typically straightforward and cure rates are extremely high if we catch it early.   The first sign of melanoma is often a change in a mole or a new dark spot.  The ABCDE system is a way of remembering the signs of melanoma.  A for asymmetry:  The two halves do not match. B for border:  The edges of the growth are irregular. C for color:  A mixture of colors are present instead of an even brown color. D for diameter:  Melanomas are usually (but not always) greater than 6mm - the size of a pencil eraser. E for evolution:  The spot keeps changing in size, shape, and color.  Please check your skin once per month between  visits. You can use a small mirror in front and a large mirror behind you to keep an eye on the back side or your body.   If you see any new or changing lesions before your next follow-up, please call to schedule a visit.  Please continue daily skin protection including broad spectrum sunscreen SPF 30+ to sun-exposed areas, reapplying every 2 hours as needed when you're outdoors.   Staying in the shade or wearing long sleeves, sun glasses (UVA+UVB protection) and wide brim hats (4-inch brim around the entire circumference of the hat) are also recommended for sun protection.    Due to recent changes in healthcare laws, you may see results of your pathology and/or laboratory studies on MyChart before the doctors have had a chance to review them. We understand that in some cases there may be results that are confusing or concerning to you. Please understand that not all results are received at the same time and often the doctors may need to interpret multiple results in order to provide you with the best plan of care or course of treatment. Therefore, we ask that you please give us  2 business days to thoroughly review all your results before contacting the office for clarification. Should we see a critical lab result, you will be contacted sooner.  If You Need Anything After Your Visit  If you have any questions or concerns for your doctor, please call our main line at [phone] and press option 4 to reach your doctor's medical assistant. If no one answers, please leave a voicemail as directed and we will return your call as soon as possible. Messages left after 4 pm will be answered the following business day.   You may also send us  a message via MyChart. We typically respond to MyChart messages within 1-2 business days.  For prescription refills, please ask your pharmacy to contact our office. Our fax number is [phone].  If you have an urgent issue when the clinic is closed that cannot  wait until the next business day, you can page your doctor at the number below.    Please note that while we do our best to be available for urgent issues outside of office hours, we are not available 24/7.   If you have an urgent issue and are unable to reach us , you may choose to seek medical care at your doctor's office, retail clinic, urgent care center, or emergency room.  If you have a medical emergency, please immediately call 911 or go to the emergency department.  Pager Numbers  - Dr. Bary Likes: [phone]  - Dr. Annette Barters: [phone]  - Dr. Felipe Horton: [phone]   In the event of inclement weather, please call our main line at [phone] for an update on the status of any delays or closures.  Dermatology Medication Tips: Please keep the boxes that topical medications come in in order to help keep track of the instructions about where and how to use these. Pharmacies typically print the medication instructions only on the boxes and not directly on the medication tubes.   If your medication is too expensive, please contact our office at [phone] option 4 or send us  a message through MyChart.   We are unable to tell what your co-pay for medications will be in advance as this is different depending on your insurance coverage. However, we may be able to find a substitute medication at lower cost or fill out paperwork to get insurance to cover a needed medication.   If a prior authorization is required to get your medication covered by your insurance company, please allow us  1-2 business days to complete this process.  Drug prices often vary depending on where the prescription is filled and some pharmacies may offer cheaper prices.  The website www.goodrx.com contains coupons for medications through different pharmacies. The prices here do not account for what the cost may be with help from insurance (it may be cheaper with your insurance), but the website can give you the price  if you did not use any insurance.  - You can print the associated coupon and take it with your prescription to the pharmacy.  - You may also stop by our office during regular business hours and pick up a GoodRx coupon card.  - If you need your prescription sent electronically to a different pharmacy, notify our office through [facility] or by phone at [phone] option 4.     Si Usted Necesita Algo Despus de Su Visita  Tambin puede enviarnos un mensaje a travs de Clinical cytogeneticist. Por lo general respondemos a los mensajes de MyChart en el transcurso de 1 a 2 das hbiles.  Para renovar recetas, por favor pida a su farmacia que se ponga en contacto con nuestra oficina. Franz Jacks de fax es [place] [phone].  Si tiene un asunto urgente cuando la clnica est cerrada y que no puede esperar hasta el siguiente da hbil, puede llamar/localizar a su doctor(a) al nmero que aparece a continuacin.   Por favor, tenga en cuenta que aunque hacemos todo lo posible para estar disponibles para asuntos urgentes fuera del horario de [place], no estamos disponibles las 24 horas del da, los 7 [address]  [address] de la [place].   Si tiene un problema urgente y no puede comunicarse con nosotros, puede optar por buscar atencin mdica  en el consultorio de su doctor(a), en una clnica privada, en un centro de atencin urgente o en una sala de emergencias.  Si tiene Engineer, drilling, por favor llame inmediatamente al 911 o vaya a la sala de emergencias.  Nmeros de bper  - Dr. Bary Likes: [phone]  - Dra. Annette Barters: [phone]  - Dr. Felipe Horton: [phone]   En caso de inclemencias del tiempo, por favor llame a Lajuan Pila principal al [phone] para una actualizacin sobre el [place] de cualquier retraso o cierre.  Consejos para la medicacin en dermatologa: Por favor, guarde las cajas en las que vienen los medicamentos de uso tpico para ayudarle a seguir las instrucciones sobre dnde y cmo  usarlos. Las farmacias generalmente imprimen las instrucciones del medicamento slo en las cajas y no directamente en los tubos del [place].   Si su medicamento es muy caro, por favor, pngase en contacto con Bettyjane Brunet llamando al [phone] y presione la opcin 4 o envenos un mensaje a travs de Clinical cytogeneticist.   No podemos decirle cul ser su copago por los medicamentos por adelantado ya que esto es diferente dependiendo de la cobertura de su seguro. Sin embargo, es posible que podamos encontrar un medicamento sustituto a Audiological scientist un formulario para que el seguro cubra el medicamento que se considera necesario.   Si se requiere una autorizacin previa para que su compaa de seguros [place] su medicamento, por favor permtanos de 1 a 2 das hbiles para completar este proceso.  Los precios de los medicamentos varan con frecuencia dependiendo del Environmental consultant de dnde se surte la receta y alguna farmacias pueden ofrecer precios ms baratos.  El sitio web www.goodrx.com tiene cupones para medicamentos de Health and safety inspector. Los precios aqu no tienen en cuenta lo que podra costar con la ayuda del seguro (puede ser ms barato con su seguro), pero el sitio web puede darle el precio si no utiliz Tourist information centre manager.  - Puede imprimir el cupn correspondiente y llevarlo con su receta a la farmacia.  - Tambin puede pasar por nuestra oficina durante el horario de atencin regular y Education officer, museum una tarjeta de cupones de GoodRx.  - Si necesita que su receta se enve electrnicamente a una farmacia diferente, informe a nuestra oficina a travs de MyChart de [place] o por telfono llamando al [phone] y presione la opcin 4.

## 2024-02-08 NOTE — Progress Notes (Unsigned)
 Follow-Up Visit   Subjective  Steven Calderon is a 68 y.o. male who presents for the following: Skin Cancer Screening and Full Body Skin Exam Hx of aks  at scalp used 107f/u calcipotriene back in June 2024 got very red and crusty. Patient has history of aks,  Hx of folliculitis at chest Hx of fever blisters at lips   The patient presents for Total-Body Skin Exam (TBSE) for skin cancer screening and mole check. The patient has spots, moles and lesions to be evaluated, some may be new or changing and the patient may have concern these could be cancer.  The following portions of the chart were reviewed this encounter and updated as appropriate: medications, allergies, medical history  Review of Systems:  No other skin or systemic complaints except as noted in HPI or Assessment and Plan.  Objective  Well appearing patient in no apparent distress; mood and affect are within normal limits.  A full examination was performed including scalp, head, eyes, ears, nose, lips, neck, chest, axillae, abdomen, back, buttocks, bilateral upper extremities, bilateral lower extremities, hands, feet, fingers, toes, fingernails, and toenails. All findings within normal limits unless otherwise noted below.   Relevant physical exam findings are noted in the Assessment and Plan.  left hand x 1, left forearm x 1, scalp x 2 (4) Erythematous thin papules/macules with gritty scale.   Assessment & Plan   SKIN CANCER SCREENING PERFORMED TODAY.  LENTIGINES, SEBORRHEIC KERATOSES, HEMANGIOMAS - Benign normal skin lesions - Benign-appearing - Call for any changes  MELANOCYTIC NEVI - Tan-brown and/or pink-flesh-colored symmetric macules and papules - Benign appearing on exam today - Observation - Call clinic for new or changing moles - Recommend daily use of broad spectrum spf 30+ sunscreen to sun-exposed areas.   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  It is infectious and can be spread from person to person by direct contact.  It is not curable, but is treatable with topical and oral medication. Related Medications valACYclovir  (VALTREX ) 1000 MG tablet 2 po with first symptoms of fever blister then 2 po 12 hours later  ACTINIC KERATOSIS (4) left hand x 1, left forearm x 1, scalp x 2 (4) Patient advised to call or send mychart if spots treated with Ln2 are not resolved in several months   Restart cream July 15 - Start 5-fluorouracil /calcipotriene cream twice a day for 10 days to affected areas including scalp. Prescription sent to [facility]. Patient advised they will receive an email to purchase the medication online and have it sent to their home. Patient provided with handout reviewing treatment course and side effects and advised to call or message us  on MyChart with any concerns.  Reviewed course of treatment and expected reaction.  Patient advised to expect inflammation and crusting and advised that erosions are possible.  Patient advised to be diligent with sun protection during and after treatment. Counseled to keep medication out of reach of children and pets.  ACTINIC DAMAGE WITH PRECANCEROUS ACTINIC KERATOSES Counseling for Topical Chemotherapy Management: Patient exhibits: - Severe, confluent actinic changes with pre-cancerous actinic keratoses that is secondary to cumulative UV radiation exposure over time - Condition that is severe; chronic, not at goal. - diffuse scaly erythematous macules and papules with underlying dyspigmentation - Discussed Prescription "Field Treatment" topical Chemotherapy for  Severe, Chronic Confluent Actinic Changes with Pre-Cancerous Actinic Keratoses Field treatment involves treatment of an entire area of skin that has confluent  Actinic Changes (Sun/ Ultraviolet light damage) and PreCancerous Actinic Keratoses by method of PhotoDynamic Therapy (PDT) and/or prescription Topical Chemotherapy agents such as 5-fluorouracil , 5-fluorouracil /calcipotriene, and/or imiquimod.  The purpose is to decrease the number of clinically evident and subclinical PreCancerous lesions to prevent progression to development of skin cancer by chemically destroying early precancer changes that may or may not be visible.  It has been shown to reduce the risk of developing skin cancer in the treated area. As a result of treatment, redness, scaling, crusting, and open sores may occur during treatment course. One or more than one of these methods may be used and may have to be used several times to control, suppress and eliminate the PreCancerous changes. Discussed treatment course, expected reaction, and possible side effects. - Recommend daily broad spectrum sunscreen SPF 30+ to sun-exposed areas, reapply every 2 hours as needed.  - Staying in the shade or wearing long sleeves, sun glasses (UVA+UVB protection) and wide brim hats (4-inch brim around the entire circumference of the hat) are also recommended. - Call for new or changing lesions.  Destruction of lesion - left hand x 1, left forearm x 1, scalp x 2 (4) Complexity: simple   Destruction method: cryotherapy   Informed consent: discussed and consent obtained   Timeout:  patient name, date of birth, surgical site, and procedure verified Lesion destroyed using liquid nitrogen: Yes   Region frozen until ice ball extended beyond lesion: Yes   Outcome: patient tolerated procedure well with no complications   Post-procedure details: wound care instructions given   Return in about 1 year (around 02/07/2025) for TBSE.  IRandee Busing, CMA, am acting as scribe for Celine Collard, MD.   Documentation: I have reviewed the above documentation for accuracy and completeness, and I agree with the  above.  Celine Collard, MD

## 2024-02-09 MED ORDER — VALACYCLOVIR HCL 1 G PO TABS: ORAL_TABLET | ORAL | 11 refills | Status: AC

## 2024-02-29 ENCOUNTER — Other Ambulatory Visit
Admission: RE | Admit: 2024-02-29 | Discharge: 2024-02-29 | Disposition: A | Discharge status: Home / Self Care | Source: Ambulatory Visit | Attending: Internal Medicine | Admitting: Internal Medicine

## 2024-02-29 DIAGNOSIS — E782 Mixed hyperlipidemia: Secondary | ICD-10-CM | POA: Diagnosis present

## 2024-02-29 LAB — CBC WITH DIFFERENTIAL/PLATELET
Abs Immature Granulocytes: 0.02 10*3/uL (ref 0.00–0.07)
Basophils Absolute: 0 10*3/uL (ref 0.0–0.1)
Basophils Relative: 0 %
Eosinophils Absolute: 0.4 10*3/uL (ref 0.0–0.5)
Eosinophils Relative: 4 %
HCT: 39.7 % (ref 39.0–52.0)
Hemoglobin: 13.4 g/dL (ref 13.0–17.0)
Immature Granulocytes: 0 %
Lymphocytes Relative: 69 %
Lymphs Abs: 7 10*3/uL — ABNORMAL HIGH (ref 0.7–4.0)
MCH: 32.6 pg (ref 26.0–34.0)
MCHC: 33.8 g/dL (ref 30.0–36.0)
MCV: 96.6 fL (ref 80.0–100.0)
Monocytes Absolute: 0.4 10*3/uL (ref 0.1–1.0)
Monocytes Relative: 4 %
Neutro Abs: 2.3 10*3/uL (ref 1.7–7.7)
Neutrophils Relative %: 23 %
Platelets: 159 10*3/uL (ref 150–400)
RBC: 4.11 MIL/uL — ABNORMAL LOW (ref 4.22–5.81)
RDW: 13.4 % (ref 11.5–15.5)
WBC: 10.2 10*3/uL (ref 4.0–10.5)
nRBC: 0 % (ref 0.0–0.2)

## 2024-04-01 ENCOUNTER — Other Ambulatory Visit: Payer: Self-pay | Admitting: Internal Medicine

## 2024-04-01 DIAGNOSIS — R911 Solitary pulmonary nodule: Secondary | ICD-10-CM

## 2024-04-01 DIAGNOSIS — Z8781 Personal history of (healed) traumatic fracture: Secondary | ICD-10-CM

## 2024-05-04 ENCOUNTER — Other Ambulatory Visit: Payer: Self-pay | Admitting: Internal Medicine

## 2024-05-04 DIAGNOSIS — I2089 Other forms of angina pectoris: Secondary | ICD-10-CM

## 2024-05-04 DIAGNOSIS — R0609 Other forms of dyspnea: Secondary | ICD-10-CM

## 2024-06-02 ENCOUNTER — Encounter (HOSPITAL_COMMUNITY): Payer: Self-pay

## 2024-06-06 ENCOUNTER — Other Ambulatory Visit: Payer: Self-pay | Admitting: Cardiology

## 2024-06-06 ENCOUNTER — Ambulatory Visit
Admission: RE | Admit: 2024-06-06 | Discharge: 2024-06-06 | Disposition: A | Discharge status: Home / Self Care | Source: Ambulatory Visit | Attending: Cardiology | Admitting: Cardiology

## 2024-06-06 ENCOUNTER — Ambulatory Visit
Admission: RE | Admit: 2024-06-06 | Discharge: 2024-06-06 | Disposition: A | Discharge status: Home / Self Care | Source: Ambulatory Visit | Attending: Internal Medicine | Admitting: Internal Medicine

## 2024-06-06 DIAGNOSIS — R911 Solitary pulmonary nodule: Secondary | ICD-10-CM | POA: Insufficient documentation

## 2024-06-06 DIAGNOSIS — I2089 Other forms of angina pectoris: Secondary | ICD-10-CM | POA: Insufficient documentation

## 2024-06-06 DIAGNOSIS — I25118 Atherosclerotic heart disease of native coronary artery with other forms of angina pectoris: Secondary | ICD-10-CM | POA: Diagnosis present

## 2024-06-06 DIAGNOSIS — Z8781 Personal history of (healed) traumatic fracture: Secondary | ICD-10-CM | POA: Diagnosis present

## 2024-06-06 DIAGNOSIS — R931 Abnormal findings on diagnostic imaging of heart and coronary circulation: Secondary | ICD-10-CM

## 2024-06-06 DIAGNOSIS — R0609 Other forms of dyspnea: Secondary | ICD-10-CM | POA: Diagnosis present

## 2024-06-06 MED ORDER — NITROGLYCERIN 0.4 MG SL SUBL
0.8000 mg | SUBLINGUAL_TABLET | Freq: Once | SUBLINGUAL | Status: AC
  Administered 2024-06-06: 0.8 mg via SUBLINGUAL
  Filled 2024-06-06: qty 25

## 2024-06-06 MED ORDER — IOHEXOL 350 MG/ML SOLN
100.0000 mL | Freq: Once | INTRAVENOUS | Status: AC | PRN
  Administered 2024-06-06: 100 mL via INTRAVENOUS

## 2024-06-06 NOTE — Progress Notes (Signed)
 Patient tolerated CT well. Vital signs stable encourage to drink water throughout day.Reasons explained and verbalized understanding. Ambulated steady gait.

## 2024-06-30 ENCOUNTER — Other Ambulatory Visit

## 2024-08-03 ENCOUNTER — Other Ambulatory Visit: Payer: Self-pay | Admitting: Internal Medicine

## 2024-08-03 DIAGNOSIS — I429 Cardiomyopathy, unspecified: Secondary | ICD-10-CM

## 2024-09-07 ENCOUNTER — Other Ambulatory Visit: Payer: Self-pay | Admitting: Internal Medicine

## 2024-09-07 ENCOUNTER — Ambulatory Visit
Admission: RE | Admit: 2024-09-07 | Discharge: 2024-09-07 | Disposition: A | Discharge status: Home / Self Care | Source: Ambulatory Visit | Attending: Internal Medicine | Admitting: Internal Medicine

## 2024-09-07 DIAGNOSIS — I429 Cardiomyopathy, unspecified: Secondary | ICD-10-CM | POA: Diagnosis not present

## 2024-09-07 MED ORDER — GADOBUTROL 1 MMOL/ML IV SOLN
9.0000 mL | Freq: Once | INTRAVENOUS | Status: AC | PRN
  Administered 2024-09-07: 9 mL via INTRAVENOUS

## 2024-09-20 ENCOUNTER — Other Ambulatory Visit
Admission: RE | Admit: 2024-09-20 | Discharge: 2024-09-20 | Disposition: A | Discharge status: Home / Self Care | Source: Ambulatory Visit | Attending: Internal Medicine | Admitting: Internal Medicine

## 2024-09-20 DIAGNOSIS — E782 Mixed hyperlipidemia: Secondary | ICD-10-CM | POA: Diagnosis present

## 2024-09-20 LAB — CBC WITH DIFFERENTIAL/PLATELET
Abs Immature Granulocytes: 0.03 K/uL (ref 0.00–0.07)
Basophils Absolute: 0.1 K/uL (ref 0.0–0.1)
Basophils Relative: 0 %
Eosinophils Absolute: 0.4 K/uL (ref 0.0–0.5)
Eosinophils Relative: 3 %
HCT: 40.4 % (ref 39.0–52.0)
Hemoglobin: 13.4 g/dL (ref 13.0–17.0)
Immature Granulocytes: 0 %
Lymphocytes Relative: 73 %
Lymphs Abs: 9.5 K/uL — ABNORMAL HIGH (ref 0.7–4.0)
MCH: 32.5 pg (ref 26.0–34.0)
MCHC: 33.2 g/dL (ref 30.0–36.0)
MCV: 98.1 fL (ref 80.0–100.0)
Monocytes Absolute: 0.8 K/uL (ref 0.1–1.0)
Monocytes Relative: 6 %
Neutro Abs: 2.3 K/uL (ref 1.7–7.7)
Neutrophils Relative %: 18 %
Platelets: 181 K/uL (ref 150–400)
RBC: 4.12 MIL/uL — ABNORMAL LOW (ref 4.22–5.81)
RDW: 13.7 % (ref 11.5–15.5)
Smear Review: NORMAL
WBC: 13 K/uL — ABNORMAL HIGH (ref 4.0–10.5)
nRBC: 0 % (ref 0.0–0.2)

## 2024-09-20 LAB — PATHOLOGIST SMEAR REVIEW

## 2025-02-13 ENCOUNTER — Ambulatory Visit: Admitting: Dermatology
